# Patient Record
Sex: Female | Born: 1950
Health system: Southern US, Community
[De-identification: ages and names within clinical notes are randomized; demographics above are authoritative.]

## PROBLEM LIST (undated history)

## (undated) DIAGNOSIS — K219 Gastro-esophageal reflux disease without esophagitis: Secondary | ICD-10-CM

## (undated) DIAGNOSIS — F329 Major depressive disorder, single episode, unspecified: Secondary | ICD-10-CM

## (undated) DIAGNOSIS — N3010 Interstitial cystitis (chronic) without hematuria: Secondary | ICD-10-CM

## (undated) DIAGNOSIS — D1803 Hemangioma of intra-abdominal structures: Secondary | ICD-10-CM

## (undated) DIAGNOSIS — F32A Depression, unspecified: Secondary | ICD-10-CM

## (undated) DIAGNOSIS — I10 Essential (primary) hypertension: Secondary | ICD-10-CM

## (undated) DIAGNOSIS — M1990 Unspecified osteoarthritis, unspecified site: Secondary | ICD-10-CM

## (undated) DIAGNOSIS — F419 Anxiety disorder, unspecified: Secondary | ICD-10-CM

## (undated) DIAGNOSIS — N200 Calculus of kidney: Secondary | ICD-10-CM

## (undated) DIAGNOSIS — K222 Esophageal obstruction: Secondary | ICD-10-CM

## (undated) DIAGNOSIS — M8580 Other specified disorders of bone density and structure, unspecified site: Secondary | ICD-10-CM

## (undated) DIAGNOSIS — E7800 Pure hypercholesterolemia, unspecified: Secondary | ICD-10-CM

## (undated) HISTORY — DX: Calculus of kidney: N20.0

## (undated) HISTORY — DX: Depression, unspecified: F32.A

## (undated) HISTORY — PX: KNEE ARTHROSCOPY: SUR90

## (undated) HISTORY — DX: Pure hypercholesterolemia, unspecified: E78.00

## (undated) HISTORY — DX: Other specified disorders of bone density and structure, unspecified site: M85.80

## (undated) HISTORY — DX: Essential (primary) hypertension: I10

## (undated) HISTORY — DX: Unspecified osteoarthritis, unspecified site: M19.90

## (undated) HISTORY — PX: COLONOSCOPY: SHX174

## (undated) HISTORY — DX: Gastro-esophageal reflux disease without esophagitis: K21.9

## (undated) HISTORY — PX: AUGMENTATION MAMMAPLASTY: SUR837

## (undated) HISTORY — PX: APPENDECTOMY: SHX54

## (undated) HISTORY — DX: Major depressive disorder, single episode, unspecified: F32.9

## (undated) HISTORY — DX: Anxiety disorder, unspecified: F41.9

## (undated) HISTORY — PX: TUBAL LIGATION: SHX77

## (undated) HISTORY — DX: Esophageal obstruction: K22.2

## (undated) HISTORY — PX: TOTAL ABDOMINAL HYSTERECTOMY: SHX209

## (undated) HISTORY — PX: TOTAL ABDOMINAL HYSTERECTOMY W/ BILATERAL SALPINGOOPHORECTOMY: SHX83

## (undated) HISTORY — PX: UPPER GASTROINTESTINAL ENDOSCOPY: SHX188

## (undated) HISTORY — PX: OTHER SURGICAL HISTORY: SHX169

## (undated) HISTORY — DX: Hemangioma of intra-abdominal structures: D18.03

## (undated) HISTORY — DX: Interstitial cystitis (chronic) without hematuria: N30.10

---

## 1972-10-29 HISTORY — PX: APPENDECTOMY: SHX54

## 1975-10-30 DIAGNOSIS — D693 Immune thrombocytopenic purpura: Secondary | ICD-10-CM

## 1975-10-30 HISTORY — DX: Immune thrombocytopenic purpura: D69.3

## 1975-10-30 HISTORY — PX: TUBAL LIGATION: SHX77

## 1976-10-29 HISTORY — PX: TOTAL ABDOMINAL HYSTERECTOMY W/ BILATERAL SALPINGOOPHORECTOMY: SHX83

## 1977-10-29 HISTORY — PX: AUGMENTATION MAMMAPLASTY: SUR837

## 1978-10-29 HISTORY — PX: KNEE ARTHROSCOPY: SUR90

## 1978-10-29 HISTORY — PX: WISDOM TOOTH EXTRACTION: SHX21

## 2004-11-08 ENCOUNTER — Ambulatory Visit (HOSPITAL_COMMUNITY): Admission: RE | Admit: 2004-11-08 | Discharge: 2004-11-08 | Payer: Self-pay | Admitting: Obstetrics and Gynecology

## 2005-03-06 ENCOUNTER — Ambulatory Visit: Payer: Self-pay | Admitting: Internal Medicine

## 2005-03-21 ENCOUNTER — Encounter (INDEPENDENT_AMBULATORY_CARE_PROVIDER_SITE_OTHER): Payer: Self-pay | Admitting: Specialist

## 2005-03-21 ENCOUNTER — Ambulatory Visit: Payer: Self-pay | Admitting: Internal Medicine

## 2005-03-21 DIAGNOSIS — K222 Esophageal obstruction: Secondary | ICD-10-CM

## 2005-03-21 DIAGNOSIS — K5730 Diverticulosis of large intestine without perforation or abscess without bleeding: Secondary | ICD-10-CM | POA: Insufficient documentation

## 2005-03-21 DIAGNOSIS — D126 Benign neoplasm of colon, unspecified: Secondary | ICD-10-CM | POA: Insufficient documentation

## 2005-11-19 ENCOUNTER — Ambulatory Visit (HOSPITAL_COMMUNITY): Admission: RE | Admit: 2005-11-19 | Discharge: 2005-11-19 | Payer: Self-pay | Admitting: Obstetrics and Gynecology

## 2005-12-05 ENCOUNTER — Encounter: Admission: RE | Admit: 2005-12-05 | Discharge: 2005-12-05 | Payer: Self-pay | Admitting: Obstetrics and Gynecology

## 2006-12-09 ENCOUNTER — Ambulatory Visit (HOSPITAL_COMMUNITY): Admission: RE | Admit: 2006-12-09 | Discharge: 2006-12-09 | Payer: Self-pay | Admitting: Obstetrics and Gynecology

## 2007-12-11 ENCOUNTER — Ambulatory Visit (HOSPITAL_COMMUNITY): Admission: RE | Admit: 2007-12-11 | Discharge: 2007-12-11 | Payer: Self-pay | Admitting: Obstetrics and Gynecology

## 2008-02-18 ENCOUNTER — Ambulatory Visit (HOSPITAL_COMMUNITY): Admission: RE | Admit: 2008-02-18 | Discharge: 2008-02-18 | Payer: Self-pay | Admitting: Internal Medicine

## 2008-02-18 ENCOUNTER — Ambulatory Visit (HOSPITAL_COMMUNITY): Admission: RE | Admit: 2008-02-18 | Discharge: 2008-02-18 | Payer: Self-pay | Admitting: Pulmonary Disease

## 2008-10-29 HISTORY — PX: LIVER RESECTION: SHX1977

## 2008-10-29 HISTORY — PX: CHOLECYSTECTOMY: SHX55

## 2008-11-06 ENCOUNTER — Encounter (INDEPENDENT_AMBULATORY_CARE_PROVIDER_SITE_OTHER): Payer: Self-pay | Admitting: *Deleted

## 2008-11-06 ENCOUNTER — Inpatient Hospital Stay (HOSPITAL_COMMUNITY): Admission: EM | Admit: 2008-11-06 | Discharge: 2008-11-08 | Payer: Self-pay | Admitting: Emergency Medicine

## 2008-11-08 ENCOUNTER — Encounter (INDEPENDENT_AMBULATORY_CARE_PROVIDER_SITE_OTHER): Payer: Self-pay | Admitting: *Deleted

## 2008-11-09 ENCOUNTER — Telehealth: Payer: Self-pay | Admitting: Internal Medicine

## 2008-11-09 DIAGNOSIS — R1310 Dysphagia, unspecified: Secondary | ICD-10-CM | POA: Insufficient documentation

## 2008-11-09 DIAGNOSIS — E785 Hyperlipidemia, unspecified: Secondary | ICD-10-CM

## 2008-11-09 DIAGNOSIS — N200 Calculus of kidney: Secondary | ICD-10-CM | POA: Insufficient documentation

## 2008-11-09 DIAGNOSIS — J309 Allergic rhinitis, unspecified: Secondary | ICD-10-CM | POA: Insufficient documentation

## 2008-11-10 ENCOUNTER — Encounter: Payer: Self-pay | Admitting: Internal Medicine

## 2008-11-10 ENCOUNTER — Ambulatory Visit: Payer: Self-pay | Admitting: Gastroenterology

## 2008-11-10 DIAGNOSIS — D1803 Hemangioma of intra-abdominal structures: Secondary | ICD-10-CM | POA: Insufficient documentation

## 2008-11-10 DIAGNOSIS — R112 Nausea with vomiting, unspecified: Secondary | ICD-10-CM | POA: Insufficient documentation

## 2008-11-10 DIAGNOSIS — R933 Abnormal findings on diagnostic imaging of other parts of digestive tract: Secondary | ICD-10-CM | POA: Insufficient documentation

## 2008-11-10 DIAGNOSIS — R1011 Right upper quadrant pain: Secondary | ICD-10-CM | POA: Insufficient documentation

## 2008-11-11 ENCOUNTER — Telehealth: Payer: Self-pay | Admitting: Physician Assistant

## 2008-11-11 LAB — CONVERTED CEMR LAB
ALT: 17 units/L (ref 0–35)
Eosinophils Absolute: 0.1 10*3/uL (ref 0.0–0.7)
Eosinophils Relative: 1.6 % (ref 0.0–5.0)
HCT: 40.3 % (ref 36.0–46.0)
Hemoglobin: 14.1 g/dL (ref 12.0–15.0)
MCV: 94.7 fL (ref 78.0–100.0)
Monocytes Absolute: 0.4 10*3/uL (ref 0.1–1.0)
Monocytes Relative: 6.3 % (ref 3.0–12.0)
Neutro Abs: 3.8 10*3/uL (ref 1.4–7.7)
Platelets: 218 10*3/uL (ref 150–400)
RDW: 12 % (ref 11.5–14.6)
Total Protein: 7.6 g/dL (ref 6.0–8.3)
WBC: 5.8 10*3/uL (ref 4.5–10.5)

## 2008-11-16 ENCOUNTER — Encounter: Payer: Self-pay | Admitting: Gastroenterology

## 2008-11-28 ENCOUNTER — Encounter: Payer: Self-pay | Admitting: Gastroenterology

## 2008-12-14 ENCOUNTER — Encounter: Payer: Self-pay | Admitting: Gastroenterology

## 2009-03-07 ENCOUNTER — Ambulatory Visit (HOSPITAL_COMMUNITY): Admission: RE | Admit: 2009-03-07 | Discharge: 2009-03-07 | Payer: Self-pay | Admitting: Obstetrics and Gynecology

## 2010-03-09 ENCOUNTER — Ambulatory Visit (HOSPITAL_COMMUNITY): Admission: RE | Admit: 2010-03-09 | Discharge: 2010-03-09 | Payer: Self-pay | Admitting: Obstetrics and Gynecology

## 2010-05-02 ENCOUNTER — Encounter (INDEPENDENT_AMBULATORY_CARE_PROVIDER_SITE_OTHER): Payer: Self-pay

## 2010-05-02 ENCOUNTER — Ambulatory Visit: Payer: Self-pay | Admitting: Internal Medicine

## 2010-05-02 ENCOUNTER — Telehealth: Payer: Self-pay | Admitting: Internal Medicine

## 2010-05-03 ENCOUNTER — Ambulatory Visit: Payer: Self-pay | Admitting: Internal Medicine

## 2010-10-29 HISTORY — PX: BASAL CELL CARCINOMA EXCISION: SHX1214

## 2010-11-28 NOTE — Miscellaneous (Signed)
Summary: Lec previsit  Clinical Lists Changes  Observations: Added new observation of NKA: T (05/02/2010 14:06)

## 2010-11-28 NOTE — Miscellaneous (Signed)
Summary: rx prilosec  Clinical Lists Changes  Medications: Added new medication of PRILOSEC 20 MG  CPDR (OMEPRAZOLE) 1 po each day 30 minutes before meal - Signed Rx of PRILOSEC 20 MG  CPDR (OMEPRAZOLE) 1 po each day 30 minutes before meal;  #30 x 3;  Signed;  Entered by: Sherren Kerns RN;  Authorized by: Hart Carwin MD;  Method used: Electronically to [facility] [address], [place], [place], [place]  [address], Ph: [phone], Fax: [phone] Observations: Added new observation of ALLERGY REV: Done (05/03/2010 16:08)    Prescriptions: PRILOSEC 20 MG  CPDR (OMEPRAZOLE) 1 po each day 30 minutes before meal  #30 x 3   Entered by:   Sherren Kerns RN   Authorized by:   Hart Carwin MD   Signed by:   Sherren Kerns RN on 05/03/2010   Method used:   Electronically to        [facility] (retail)       [address]       [place], [place]  [address]       Ph: [phone]       Fax: [phone]   RxID:   [id number]

## 2010-11-28 NOTE — Procedures (Signed)
Summary: Upper Endoscopy  Patient: [name] Note: All result statuses are Final unless otherwise noted.  Tests: (1) Upper Endoscopy (EGD)   EGD Upper Endoscopy       DONE     [facility]     [address]     [place], [place]  [address]           ENDOSCOPY PROCEDURE REPORT           PATIENT:  Kelly Lozano, Kelly Lozano  MR#:  [id number]     BIRTHDATE:  Nov 01, 1950, 59 yrs. old  GENDER:  female           ENDOSCOPIST:  Hedwig Morton. Juanda Chance, MD     Referred by:  Carylon Perches, M.D.           PROCEDURE DATE:  05/03/2010     PROCEDURE:  EGD with dilatation over guidewire     ASA CLASS:  Class I     INDICATIONS:  dysphagia hx es. stricture 1998 and 2006,     2 episodes of food impaction in last week           MEDICATIONS:   Versed 8 mg, Fentanyl 75 mcg     TOPICAL ANESTHETIC:  Exactacain Spray           DESCRIPTION OF PROCEDURE:   After the risks benefits and     alternatives of the procedure were thoroughly explained, informed     consent was obtained.  The [facility] GIF-H180 [id number] endoscope was     introduced through the mouth and advanced to the second portion of     the duodenum, without limitations.  The instrument was slowly     withdrawn as the mucosa was fully examined.     <<PROCEDUREIMAGES>>           A stricture was found in the distal esophagus (see image1). mild     stricture with esophagitis Savary dilation over a guidewire     14,,15,16 mm dilators, small amount of blood on the last dilator     Otherwise the examination was normal (see image6, image5, image4,     and image3).    Retroflexed views revealed no abnormalities.     The scope was then withdrawn from the patient and the procedure     completed.           COMPLICATIONS:  None           ENDOSCOPIC IMPRESSION:     1) Stricture in the distal esophagus     2) Otherwise normal examination     s/p Savary dilation to 10F     RECOMMENDATIONS:     1) Anti-reflux regimen to be follow     Prelosec 20 mg, # 30, 1 po aqd, 3  refills           REPEAT EXAM:  In 0 year(s) for.  redilate prn           ______________________________     Hedwig Morton. Juanda Chance, MD           CC:           n.     eSIGNED:   Hedwig Morton. Crystale Giannattasio at 05/03/2010 03:47 PM           Quay Burow, [id number]  Note: An exclamation mark (!) indicates a result that was not dispersed into the flowsheet. Document Creation Date: 05/03/2010 3:47 PM _______________________________________________________________________  (1) Order result status: Final Collection  or observation date-time: 05/03/2010 15:38 Requested date-time:  Receipt date-time:  Reported date-time:  Referring Physician:   Ordering Physician: Lina Sar [phone]) Specimen Source:  Source: Launa Grill Order Number: [id number] Lab site:

## 2010-11-28 NOTE — Progress Notes (Signed)
Summary: triage  Phone Note Call from Patient Call back at [phone]   Caller: Patient Call For: Juanda Chance Reason for Call: Talk to Nurse Summary of Call: Patient wants t be seen today by anyone states that she choked twice this weekend. Initial call taken by: Tawni Levy,  May 02, 2010 8:25 AM  Follow-up for Phone Call        Patient  with a hx of esophageal stricture in 06 with dil.  Patient  c/o 2 episodes over the weekend of choking and had to have the heimlich performed on her twice.  Discussed with Dr Juanda Chance patient is scheduled for ESA for 05/03/10 3:00, she will come in for pre-visit today at 2:30 Follow-up by: Darcey Nora RN, CGRN,  May 02, 2010 9:07 AM

## 2010-11-28 NOTE — Letter (Signed)
Summary: EGD Instructions  [facility]  [address] [place], [place] [address]   Phone: [phone]  Fax: [phone]       Kelly Lozano    20-Feb-1951    MRN: [id number]       Procedure Day Dorna Bloom:  [facility]  05/03/10     Arrival Time:  2:00pm     Procedure Time: 3:00pm     Location of Procedure:                    [name]   PREPARATION FOR ENDOSCOPY   On [facility], 07/06  THE DAY OF THE PROCEDURE:  1.   No solid foods, milk or milk products are allowed after midnight the night before your procedure.  2.   Do not drink anything colored red or purple.  Avoid juices with pulp.  No orange juice.  3.  You may drink clear liquids until 1:00pm , which is 2 hours before your procedure.                                                                                                CLEAR LIQUIDS INCLUDE: Water Jello Ice Popsicles Tea (sugar ok, no milk/cream) Powdered fruit flavored drinks Coffee (sugar ok, no milk/cream) Gatorade Juice: apple, white grape, white cranberry  Lemonade Clear bullion, consomm, broth Carbonated beverages (any kind) Strained chicken noodle soup Hard Candy   MEDICATION INSTRUCTIONS  Unless otherwise instructed, you should take regular prescription medications with a small sip of water as early as possible the morning of your procedure.          OTHER INSTRUCTIONS  You will need a responsible adult at least 59 years of age to accompany you and drive you home.   This person must remain in the waiting room during your procedure.  Wear loose fitting clothing that is easily removed.  Leave jewelry and other valuables at home.  However, you may wish to bring a book to read or an iPod/MP3 player to listen to music as you wait for your procedure to start.  Remove all body piercing jewelry and leave at home.  Total time from sign-in until discharge is approximately 2-3 hours.  You should go  home directly after your procedure and rest.  You can resume normal activities the day after your procedure.  The day of your procedure you should not:   Drive   Make legal decisions   Operate machinery   Drink alcohol   Return to work  You will receive specific instructions about eating, activities and medications before you leave.    The above instructions have been reviewed and explained to me by   Ulis Rias RN  May 02, 2010 3:04 PM     I fully understand and can verbalize these instructions _____________________________ Date _________

## 2011-01-18 ENCOUNTER — Other Ambulatory Visit: Payer: Self-pay | Admitting: *Deleted

## 2011-01-18 MED ORDER — OMEPRAZOLE 20 MG PO CPDR: 20.0000 mg | DELAYED_RELEASE_CAPSULE | Freq: Every day | ORAL | Status: DC

## 2011-02-12 LAB — URINE MICROSCOPIC-ADD ON

## 2011-02-12 LAB — DIFFERENTIAL
Basophils Absolute: 0 10*3/uL (ref 0.0–0.1)
Eosinophils Relative: 0 % (ref 0–5)
Lymphocytes Relative: 17 % (ref 12–46)
Lymphocytes Relative: 24 % (ref 12–46)
Lymphs Abs: 1.3 10*3/uL (ref 0.7–4.0)
Lymphs Abs: 1.8 10*3/uL (ref 0.7–4.0)
Monocytes Absolute: 0.4 10*3/uL (ref 0.1–1.0)
Monocytes Absolute: 0.8 10*3/uL (ref 0.1–1.0)
Monocytes Relative: 7 % (ref 3–12)
Monocytes Relative: 7 % (ref 3–12)
Neutro Abs: 7.8 10*3/uL — ABNORMAL HIGH (ref 1.7–7.7)

## 2011-02-12 LAB — CBC
HCT: 33.6 % — ABNORMAL LOW (ref 36.0–46.0)
HCT: 34.6 % — ABNORMAL LOW (ref 36.0–46.0)
HCT: 38.2 % (ref 36.0–46.0)
Hemoglobin: 11.3 g/dL — ABNORMAL LOW (ref 12.0–15.0)
Hemoglobin: 13 g/dL (ref 12.0–15.0)
MCV: 95.4 fL (ref 78.0–100.0)
RBC: 3.63 MIL/uL — ABNORMAL LOW (ref 3.87–5.11)
RBC: 4.02 MIL/uL (ref 3.87–5.11)
RDW: 12.9 % (ref 11.5–15.5)
WBC: 10.5 10*3/uL (ref 4.0–10.5)
WBC: 5.5 10*3/uL (ref 4.0–10.5)
WBC: 5.7 10*3/uL (ref 4.0–10.5)

## 2011-02-12 LAB — COMPREHENSIVE METABOLIC PANEL
AST: 17 U/L (ref 0–37)
Alkaline Phosphatase: 62 U/L (ref 39–117)
BUN: 3 mg/dL — ABNORMAL LOW (ref 6–23)
CO2: 27 mEq/L (ref 19–32)
Chloride: 108 mEq/L (ref 96–112)
Creatinine, Ser: 0.84 mg/dL (ref 0.4–1.2)
GFR calc Af Amer: >60 mL/min (ref >60)
GFR calc non Af Amer: >60 mL/min (ref >60)
Total Bilirubin: 0.4 mg/dL (ref 0.3–1.2)

## 2011-02-12 LAB — BASIC METABOLIC PANEL
Calcium: 9.1 mg/dL (ref 8.4–10.5)
GFR calc Af Amer: >60 mL/min (ref >60)
GFR calc non Af Amer: >60 mL/min (ref >60)
GFR calc non Af Amer: >60 mL/min (ref >60)
Glucose, Bld: 105 mg/dL — ABNORMAL HIGH (ref 70–99)
Potassium: 4.2 mEq/L (ref 3.5–5.1)
Potassium: 4.4 mEq/L (ref 3.5–5.1)
Sodium: 139 mEq/L (ref 135–145)
Sodium: 141 mEq/L (ref 135–145)

## 2011-02-12 LAB — URINALYSIS, ROUTINE W REFLEX MICROSCOPIC
Bilirubin Urine: NEGATIVE
Glucose, UA: NEGATIVE mg/dL
Nitrite: NEGATIVE
Specific Gravity, Urine: 1.029 (ref 1.005–1.030)
pH: 5.5 (ref 5.0–8.0)

## 2011-02-12 LAB — SEDIMENTATION RATE: Sed Rate: 19 mm/hr (ref 0–22)

## 2011-02-12 LAB — LIPID PANEL
HDL: 86 mg/dL (ref >39)
Total CHOL/HDL Ratio: 2.7 RATIO

## 2011-02-12 LAB — PROTIME-INR
INR: 1 (ref 0.00–1.49)
Prothrombin Time: 13.7 seconds (ref 11.6–15.2)

## 2011-02-12 LAB — HIGH SENSITIVITY CRP: CRP, High Sensitivity: 46.9 mg/L — ABNORMAL HIGH

## 2011-02-12 LAB — HEPATIC FUNCTION PANEL
Albumin: 3.3 g/dL — ABNORMAL LOW (ref 3.5–5.2)
Alkaline Phosphatase: 66 U/L (ref 39–117)
Total Protein: 6.6 g/dL (ref 6.0–8.3)

## 2011-02-12 LAB — URINE CULTURE: Colony Count: NO GROWTH

## 2011-03-02 ENCOUNTER — Other Ambulatory Visit: Payer: Self-pay | Admitting: Obstetrics and Gynecology

## 2011-03-02 DIAGNOSIS — Z1231 Encounter for screening mammogram for malignant neoplasm of breast: Secondary | ICD-10-CM

## 2011-03-13 NOTE — H&P (Signed)
Kelly Lozano, Kelly Lozano                ACCOUNT NO.:  [id number]   MEDICAL RECORD NO.:  [id number]          PATIENT TYPE:  EMS   LOCATION:  ED                           FACILITY[facility]  [facility]   PHYSICIAN:  Marcellus Scott, MD     DATE OF BIRTH:  1950-12-01   DATE OF ADMISSION:  11/06/2008  DATE OF DISCHARGE:                              HISTORY & PHYSICAL   PRIMARY CARE PHYSICIAN:  Dr. Ouida Sills in [place], [place].   CHIEF COMPLAINT:  Right flank pain, nausea and vomiting.   HISTORY OF PRESENT ILLNESS:  Kelly Lozano is a pleasant 60 year old  Caucasian female patient with history of dyslipidemia, hemangioma of the  liver diagnosed approximately a year ago, remote history of kidney  stones.  Most of the history is obtained from the patient's spouse who  is at the bedside.  The patient currently is drowsy secondary to  medications and not able to provide significant history.   She was in her usual state of health until some time last afternoon when  she started complaining of right-sided sharp flank pain 9/10 in severity  with no radiation.  This was not associated with fever, chills, rigors,  dysuria or frequency.  There is some question of hematuria.  The patient  sought attention with her primary medical doctor and was advised  admission to the [facility].  The patient, however, declined  that and indicated that she would go home and if she got worse she would  come to the [facility] for admission.  Some time this morning  the patient started having nausea and vomiting, multiple episodes  consisting of bilious colored liquid with no coffee grounds or blood and  persisting right flank pain.  She was seen in the emergency room by the  ED physician.  Currently after pain medications and antiemetics the  patient indicates that her pain is about 3/10.  She is drowsy but easily  arousable and has slight confusion probably secondary to medications.   PAST MEDICAL HISTORY:  1. Dyslipidemia.  2. Hemangioma of the liver.  3. Kidney stones.   PAST SURGICAL HISTORY:  1. Appendectomy.  2. Hysterectomy.  3. Esophageal dilatation for strictures.   ALLERGIES:  No known drug allergies.   MEDICATIONS:  Not fully clear.  I have requested the spouse to bring her  medications for review.  1. Vitamin D one tablet daily.  2. Estrace.  3. Zetia 10 mg daily.   FAMILY HISTORY:  Noncontributory.   SOCIAL HISTORY:  The patient is married for a year.  She is a Engineer, civil (consulting) at  [employer].  She is a nonsmoker.  She occasionally drinks wine.  There is no history of drug abuse.   REVIEW OF SYSTEMS:  A complete 14 system review done and apart from  history of presenting illness is noncontributory.  Of note, the patient  has not seen any consultants for her hemangioma.  She is unable to  recollect the name of the GI physician she sees for esophageal  dilatation.   PHYSICAL EXAMINATION:  GENERAL:  Kelly Lozano is  a moderately built and  nourished female patient who is in no obvious distress at this time.  VITAL SIGNS:  Temperature 97.7 degrees Fahrenheit, blood pressure  135/80, pulse is 65 per minute and regular, respirations 16 per minute,  saturating at 97% on room air.  HEENT:  Atraumatic, normocephalic.  Pupils equally reacting to light and  accommodation.  NECK:  Supple.  No JVD or carotid bruit.  LYMPHATICS:  No lymphadenopathy.  RESPIRATORY:  Clear to auscultation.  CARDIOVASCULAR:  First and second heart sounds heard.  No murmurs.  ABDOMEN:  Nondistended.  Minimal epigastric tenderness.  No rigidity,  guarding or rebound.  No organomegaly or mass appreciated.  Bowel sounds  are normally heard.  Right renal angle tenderness.  CENTRAL NERVOUS SYSTEM:  The patient is drowsy but easily arousable and  oriented in person, place and time.  No focal neurological deficits.  EXTREMITIES:  No clubbing, cyanosis or edema.  Peripheral pulses are  symmetrically felt.  SKIN:   Without any rashes.  MUSCULOSKELETAL:  Unremarkable.   LAB DATA:  Urinalysis with 21-50 white blood cells per high-powered  field and many bacteria and also has calcium oxalate crystals.  CBC with  hemoglobin 13, hematocrit 38.2, white blood cell 10.5, platelets 196.  Basic metabolic panel unremarkable.  BUN 10, creatinine 0.78.  CT of the  abdomen without contrast, impression - redemonstration of the  heterogenous lesion in the inferior right hepatic lobe.  This lesion was  described as a giant hemangioma on the previous MR study.  The size and  configuration of this lesion has not significantly changed since February 18, 2008.  However, there is new stranding and edema adjacent to this  lesion.  No significant intra-abdominal fluid or pneumoperitoneum.  No  evidence for kidney stone or hydronephrosis.  A CT of the pelvis,  impression - no acute pelvic findings.  Status post hysterectomy.   ASSESSMENT AND PLAN:  1. Acute right pyelonephritis.  Will send urine for culture.  Will      hydrate with IV fluids and continue IV ciprofloxacin pending      culture.  The patient has received a dose in the ED.  2. Right flank pain likely secondary to acute pyelonephritis.  Other      differential diagnosis is nephrolithiasis although the CT of the      abdomen was negative for same.  Please note that although the      patient has bacteria and white blood cells in the urine she has no      fever or leukocytosis again raising suspicion for some other cause      of her right flank pain.  The other differential diagnosis is      bleeding from the hemangioma.  Will provide pain medications and      monitor.  3. Nausea and vomiting secondary to problems #1 and #2.  Clear liquids      as tolerated and advance diet as tolerated.  Will provide      antiemetic medications.  4. Hemangioma of the liver.  Rule out bleeding from same.  Will check      hemoglobin and hematocrit q.6 hourly for 24 hours.  [facility] has      been consulted.  5. Altered mental status.  Likely secondary to pain medications and      antiemetics.  Monitor.  6. Dyslipidemia.   Time spent on coordinating this admission is 45 minutes.  Marcellus Scott, MD  Electronically Signed     AH/MEDQ  D:  11/06/2008  T:  11/06/2008  Job:  [id number]   cc:   Kingsley Callander. Ouida Sills, MD  Fax: [phone]   Rachael Fee, MD  [address]  [place], [place] [address]

## 2011-03-13 NOTE — Discharge Summary (Signed)
NAMESHADAY, RAYBORN                ACCOUNT NO.:  [id number]   MEDICAL RECORD NO.:  [id number]          PATIENT TYPE:  INP   LOCATION:  1306                         FACILITY:  [facility]   PHYSICIAN:  Eduard Clos, MDDATE OF BIRTH:  Mar 17, 1951   DATE OF ADMISSION:  11/06/2008  DATE OF DISCHARGE:  11/08/2008                               DISCHARGE SUMMARY   A 60 year old female with chronic history of  extremity __________  dyslipidemia __________ patient had a UA __________ UTI, was started on  empiric antibiotics.  CT abdomen and pelvis was done without contrast  which showed __________of the liver with some edema and stranding  __________.  Patient was placed on __________ q.6.  Hemoglobin was  checked and initially GI consult called __________ discussed with the  surgeon who felt that possibly __________ at this time and that  __________ Dr. Rocky Link advised that repeated CT abdomen and pelvis done with  contrast __________ there is no evidence of bleed and patient's  hemoglobin had become stable.  Patient is tolerating diet.  __________  urine culture negative.  Patient is discharged home and advised that she  will need further followup closely __________ with primary care and  __________ to which she has agreed, which she had done today.  CT  abdomen and pelvis November 07, 2008 shows redemonstration of __________  UTI __________   MEDICATION ON DISCHARGE:  Vicodin DS __________   PLAN:  Patient __________ with her primary care physician __________  advised further workup and __________ physician __________      Eduard Clos, MD     ANK/MEDQ  D:  11/08/2008  T:  11/08/2008  Job:  [id number]

## 2011-03-15 ENCOUNTER — Ambulatory Visit (HOSPITAL_COMMUNITY)
Admission: RE | Admit: 2011-03-15 | Discharge: 2011-03-15 | Disposition: A | Discharge status: Home / Self Care | Payer: BC Managed Care – PPO | Source: Ambulatory Visit | Attending: Obstetrics and Gynecology | Admitting: Obstetrics and Gynecology

## 2011-03-15 DIAGNOSIS — Z1231 Encounter for screening mammogram for malignant neoplasm of breast: Secondary | ICD-10-CM | POA: Insufficient documentation

## 2011-03-16 NOTE — Discharge Summary (Signed)
NAMEGREGORY, DOWE                ACCOUNT NO.:  [id number]   MEDICAL RECORD NO.:  [id number]          PATIENT TYPE:  INP   LOCATION:  1306                         FACILITY:  [facility]   PHYSICIAN:  Eduard Clos, MDDATE OF BIRTH:  [birth year]   DATE OF ADMISSION:  11/06/2008  DATE OF DISCHARGE:  11/08/2008                               DISCHARGE SUMMARY   HOSPITAL COURSE:  This 60 year old female with a history of dyslipidemia  and hemangioma of the liver, possible kidney stone on the right, with  pain.  On admission the patient had a urinalysis which was compatible  with a urinary tract infection.  The patient had a CAT scan of the  abdomen and pelvis which showed a hemangioma of the liver which was  previously known, as was stated earlier in the MRI scan.  At this time  the lesion showed some stranding and edema.  Initially a GI consultation  was called, but the gastroenterologist failed to call surgery.  I did  discuss with the on-call surgeon and asked for surgery to observe the  patient and also with an opinion from the interventional radiologist.  I  discussed with the interventional radiologist who advised that we repeat  a CAT scan.  If there are no significant changes, will need to follow as  an outpatient at the GI Center.  A repeat CAT scan was done with no  significant change.  The patient's pain had improved. The urine cultures  were negative.  There was no active bleeding seen in the hemangioma.   At this time the patient was able to go home.  The patient was  discharged home.  The patient's urine cultures were negative.  The pain  was resolved.  The patient had tolerated three days off of antibiotics.  At the time of discharge the patient was hemodynamically stable.   PROCEDURE DURING STAY:  1. CT of the abdomen and pelvis on 11/06/2008, showing demonstration      of a hemangioma lesion in the inferior right hepatic lobe.  This      lesion was described as a giant  hemangioma on the previous MR      study.  The size and configuration of this lesion showed no      significant change since 02/18/2008, although there is new      stranding and edema but no significant No evidence for kidney      stones or hydronephrosis.  No Acute findings.  2. A CT of the abdomen and pelvis with contrast on 11/08/2008, showing      no evidence of active bleeding from the giant right lobe of the      liver.  No acute abnormality in the abdomen.  Mild right lower lobe      , borderline hiatal hernia.  No acute or significant abnormality in      the pelvis.   FINAL DIAGNOSES:  1. Right flank pain with hemangioma of the liver.  2. Urinary tract infection.  3. Dyslipidemia.   DISCHARGE MEDICATIONS:  1. Omeprazole 40 mg one p.o. daily.  2. Vicodin 5 mg p.o. q.4-6h. p.r.n. pain.   PLAN:  The patient advised to call her primary care physician within one  week's time.  The patient also advised that she will need to follow up  with the higher Center at [facility] for her hemangioma, for which  she has agreed.      Eduard Clos, MD  Electronically Signed     ANK/MEDQ  D:  11/25/2008  T:  11/25/2008  Job:  [id number]

## 2011-05-15 ENCOUNTER — Other Ambulatory Visit: Payer: Self-pay | Admitting: *Deleted

## 2011-05-15 MED ORDER — OMEPRAZOLE 20 MG PO CPDR: 20.0000 mg | DELAYED_RELEASE_CAPSULE | Freq: Every day | ORAL | Status: DC

## 2011-08-17 ENCOUNTER — Other Ambulatory Visit: Payer: Self-pay | Admitting: Internal Medicine

## 2011-11-23 ENCOUNTER — Other Ambulatory Visit: Payer: Self-pay | Admitting: Internal Medicine

## 2011-12-07 ENCOUNTER — Other Ambulatory Visit: Payer: Self-pay | Admitting: Dermatology

## 2012-02-26 ENCOUNTER — Telehealth: Payer: Self-pay | Admitting: Internal Medicine

## 2012-02-27 NOTE — Telephone Encounter (Signed)
Left message for patient to call back  

## 2012-02-27 NOTE — Telephone Encounter (Signed)
I have advised patient that she is indeed due for colonoscopy. Patient has scheduled a time and date for previsit and colonoscopy procedure.

## 2012-02-27 NOTE — Telephone Encounter (Signed)
Dr Juanda Chance- Patient's last colonoscopy completed 03/21/05 indicates that she needs recall colon in 5 years; however, her recall is in our system for 2016. She had only polypoid mucosa on her colonoscopy in 2006, but she has a family history of colon cancer in a parent. Should she be due for colon now or 2016? I will get chart if you need me to.

## 2012-02-27 NOTE — Telephone Encounter (Signed)
She is right, she does need a recall in 5 years. Her family hx escaped me.

## 2012-03-07 ENCOUNTER — Encounter: Payer: Self-pay | Admitting: Internal Medicine

## 2012-03-07 ENCOUNTER — Ambulatory Visit (AMBULATORY_SURGERY_CENTER): Payer: BC Managed Care – PPO | Admitting: *Deleted

## 2012-03-07 VITALS — Ht 61.0 in | Wt 155.0 lb

## 2012-03-07 DIAGNOSIS — Z860100 Personal history of colon polyps, unspecified: Secondary | ICD-10-CM

## 2012-03-07 DIAGNOSIS — Z1211 Encounter for screening for malignant neoplasm of colon: Secondary | ICD-10-CM

## 2012-03-07 DIAGNOSIS — Z800 Family history of malignant neoplasm of digestive organs: Secondary | ICD-10-CM

## 2012-03-07 MED ORDER — PEG-KCL-NACL-NASULF-NA ASC-C 100 G PO SOLR: ORAL | Status: DC

## 2012-03-12 ENCOUNTER — Ambulatory Visit (AMBULATORY_SURGERY_CENTER): Payer: BC Managed Care – PPO | Admitting: Internal Medicine

## 2012-03-12 ENCOUNTER — Encounter: Payer: Self-pay | Admitting: Internal Medicine

## 2012-03-12 VITALS — BP 128/86 | HR 58 | Temp 95.4°F | Resp 15 | Ht 61.0 in | Wt 150.0 lb

## 2012-03-12 DIAGNOSIS — Z800 Family history of malignant neoplasm of digestive organs: Secondary | ICD-10-CM

## 2012-03-12 DIAGNOSIS — Z860100 Personal history of colon polyps, unspecified: Secondary | ICD-10-CM

## 2012-03-12 DIAGNOSIS — Z1211 Encounter for screening for malignant neoplasm of colon: Secondary | ICD-10-CM

## 2012-03-12 MED ORDER — SODIUM CHLORIDE 0.9 % IV SOLN: 500.0000 mL | INTRAVENOUS | Status: DC

## 2012-03-12 NOTE — Op Note (Addendum)
[facility] [address] [place], [place]  [address]  COLONOSCOPY PROCEDURE REPORT  PATIENT:  Kelly Lozano, Kelly Lozano  MR#:  [id number] BIRTHDATE:  09/09/1951, 61 yrs. old  GENDER:  female ENDOSCOPIST:  Hedwig Morton. Juanda Chance, MD REF. BY:  Carylon Perches, M.D..Dr Meredeth Ide PROCEDURE DATE:  03/12/2012 PROCEDURE:  Colonoscopy 19147 ASA CLASS:  Class I INDICATIONS:  family history of colon cancer parent with colon cancer prior colon in 1998 and 2006- polyp removed - polypoid mucosa MEDICATIONS:   MAC sedation, administered by CRNA, propofol (Diprivan) 220 mg  DESCRIPTION OF PROCEDURE:   After the risks and benefits and of the procedure were explained, informed consent was obtained. Digital rectal exam was performed and revealed no rectal masses. The LB [id number]-[id number] endoscope was introduced through the anus and advanced to the cecum, which was identified by both the appendix and ileocecal valve.  The quality of the prep was excellent, using MoviPrep.  The instrument was then slowly withdrawn as the colon was fully examined. <<PROCEDUREIMAGES>>  FINDINGS:  Mild diverticulosis was found in the sigmoid colon (see image1).  This was otherwise a normal examination of the colon (see image4, image3, and image2).   Retroflexed views in the rectum revealed no abnormalities.    The scope was then withdrawn from the patient and the procedure completed.  COMPLICATIONS:  None ENDOSCOPIC IMPRESSION: 1) Mild diverticulosis in the sigmoid colon 2) Otherwise normal examination RECOMMENDATIONS: 1) High fiber diet.  REPEAT EXAM:  In 5 year(s) for.  ______________________________ Hedwig Morton. Juanda Chance, MD  CC:  n. REVISED:  03/12/2012 09:14 AM eSIGNED:   Hedwig Morton. Ladeidra Borys at 03/12/2012 09:14 AM  Quay Burow, [phone]

## 2012-03-12 NOTE — Progress Notes (Signed)
Patient did not experience any of the following events: a burn prior to discharge; a fall within the facility; wrong site/side/patient/procedure/implant event; or a hospital transfer or hospital admission upon discharge from the facility. (G8907) Patient did not have preoperative order for IV antibiotic SSI prophylaxis. (G8918)  

## 2012-03-12 NOTE — Patient Instructions (Signed)
Follow discharge instructions given today. Repeat colonoscopy in 5 years. Diverticulosis seen today. Try to follow high fiber diet. See handouts.  Thank you!!   YOU HAD AN ENDOSCOPIC PROCEDURE TODAY AT THE [facility]: Refer to the procedure report that was given to you for any specific questions about what was found during the examination.  If the procedure report does not answer your questions, please call your gastroenterologist to clarify.  If you requested that your care partner not be given the details of your procedure findings, then the procedure report has been included in a sealed envelope for you to review at your convenience later.  YOU SHOULD EXPECT: Some feelings of bloating in the abdomen. Passage of more gas than usual.  Walking can help get rid of the air that was put into your GI tract during the procedure and reduce the bloating. If you had a lower endoscopy (such as a colonoscopy or flexible sigmoidoscopy) you may notice spotting of blood in your stool or on the toilet paper. If you underwent a bowel prep for your procedure, then you may not have a normal bowel movement for a few days.  DIET: Your first meal following the procedure should be a light meal and then it is ok to progress to your normal diet.  A half-sandwich or bowl of soup is an example of a good first meal.  Heavy or fried foods are harder to digest and may make you feel nauseous or bloated.  Likewise meals heavy in dairy and vegetables can cause extra gas to form and this can also increase the bloating.  Drink plenty of fluids but you should avoid alcoholic beverages for 24 hours.  ACTIVITY: Your care partner should take you home directly after the procedure.  You should plan to take it easy, moving slowly for the rest of the day.  You can resume normal activity the day after the procedure however you should NOT DRIVE or use heavy machinery for 24 hours (because of the sedation medicines used during the  test).    SYMPTOMS TO REPORT IMMEDIATELY: A gastroenterologist can be reached at any hour.  During normal business hours, 8:30 AM to 5:00 PM Monday through Friday, call [phone].  After hours and on weekends, please call the GI answering service at [phone] who will take a message and have the physician on call contact you.   Following lower endoscopy (colonoscopy or flexible sigmoidoscopy):  Excessive amounts of blood in the stool  Significant tenderness or worsening of abdominal pains  Swelling of the abdomen that is new, acute  Fever of 100F or higher  FOLLOW UP: If any biopsies were taken you will be contacted by phone or by letter within the next 1-3 weeks.  Call your gastroenterologist if you have not heard about the biopsies in 3 weeks.  Our staff will call the home number listed on your records the next business day following your procedure to check on you and address any questions or concerns that you may have at that time regarding the information given to you following your procedure. This is a courtesy call and so if there is no answer at the home number and we have not heard from you through the emergency physician on call, we will assume that you have returned to your regular daily activities without incident.  SIGNATURES/CONFIDENTIALITY: You and/or your care partner have signed paperwork which will be entered into your electronic medical record.  These signatures attest to  the fact that that the information above on your After Visit Summary has been reviewed and is understood.  Full responsibility of the confidentiality of this discharge information lies with you and/or your care-partner.

## 2012-03-13 ENCOUNTER — Telehealth: Payer: Self-pay | Admitting: *Deleted

## 2012-03-13 NOTE — Telephone Encounter (Signed)
  Follow up Call-  Call back number 03/12/2012  Post procedure Call Back phone  # [phone] or [phone]  Permission to leave phone message Yes     Patient questions:  Do you have a fever, pain , or abdominal swelling? no Pain Score  0 *  Have you tolerated food without any problems? yes  Have you been able to return to your normal activities? yes  Do you have any questions about your discharge instructions: Diet   no Medications  no Follow up visit  no  Do you have questions or concerns about your Care? no  Actions: * If pain score is 4 or above: No action needed, pain <4.

## 2012-03-19 ENCOUNTER — Other Ambulatory Visit: Payer: Self-pay | Admitting: Obstetrics and Gynecology

## 2012-03-19 DIAGNOSIS — Z1231 Encounter for screening mammogram for malignant neoplasm of breast: Secondary | ICD-10-CM

## 2012-03-25 ENCOUNTER — Other Ambulatory Visit: Payer: Self-pay | Admitting: *Deleted

## 2012-03-25 MED ORDER — OMEPRAZOLE 20 MG PO CPDR: 20.0000 mg | DELAYED_RELEASE_CAPSULE | Freq: Every day | ORAL | Status: DC

## 2012-04-14 ENCOUNTER — Ambulatory Visit (HOSPITAL_COMMUNITY)
Admission: RE | Admit: 2012-04-14 | Discharge: 2012-04-14 | Disposition: A | Discharge status: Home / Self Care | Payer: BC Managed Care – PPO | Source: Ambulatory Visit | Attending: Obstetrics and Gynecology | Admitting: Obstetrics and Gynecology

## 2012-04-14 DIAGNOSIS — Z1231 Encounter for screening mammogram for malignant neoplasm of breast: Secondary | ICD-10-CM | POA: Insufficient documentation

## 2012-06-23 ENCOUNTER — Other Ambulatory Visit: Payer: Self-pay | Admitting: *Deleted

## 2012-06-23 MED ORDER — OMEPRAZOLE 20 MG PO CPDR: 20.0000 mg | DELAYED_RELEASE_CAPSULE | Freq: Every day | ORAL | Status: DC

## 2012-09-22 ENCOUNTER — Other Ambulatory Visit: Payer: Self-pay | Admitting: *Deleted

## 2012-09-22 MED ORDER — OMEPRAZOLE 20 MG PO CPDR: 20.0000 mg | DELAYED_RELEASE_CAPSULE | Freq: Every day | ORAL | Status: DC

## 2012-10-29 HISTORY — PX: EYE SURGERY: SHX253

## 2012-11-25 ENCOUNTER — Other Ambulatory Visit: Payer: Self-pay

## 2012-12-10 ENCOUNTER — Other Ambulatory Visit: Payer: Self-pay | Admitting: Dermatology

## 2013-01-24 ENCOUNTER — Other Ambulatory Visit: Payer: Self-pay | Admitting: Internal Medicine

## 2013-02-14 ENCOUNTER — Other Ambulatory Visit (HOSPITAL_COMMUNITY): Payer: Self-pay | Admitting: Obstetrics and Gynecology

## 2013-02-16 ENCOUNTER — Other Ambulatory Visit: Payer: Self-pay | Admitting: *Deleted

## 2013-02-16 ENCOUNTER — Encounter: Payer: Self-pay | Admitting: *Deleted

## 2013-02-16 MED ORDER — ERGOCALCIFEROL 1.25 MG (50000 UT) PO CAPS: 50000.0000 [IU] | ORAL_CAPSULE | ORAL | Status: DC

## 2013-02-16 NOTE — Telephone Encounter (Signed)
AEX scheduled for 03/25/13  #4/0rf's sent through to [facility] pharmacy to last pt until aex//js

## 2013-02-17 ENCOUNTER — Other Ambulatory Visit: Payer: Self-pay | Admitting: *Deleted

## 2013-02-17 MED ORDER — ERGOCALCIFEROL 1.25 MG (50000 UT) PO CAPS: 50000.0000 [IU] | ORAL_CAPSULE | ORAL | Status: DC

## 2013-02-28 ENCOUNTER — Other Ambulatory Visit (HOSPITAL_COMMUNITY): Payer: Self-pay | Admitting: Obstetrics and Gynecology

## 2013-03-02 NOTE — Telephone Encounter (Signed)
eScribe request for refill on MINIVELLE Last filled - 02/22/12, 3months x 3 refills Last AEX - 02/22/12 Next AEX - 03/25/13 One month supply sent to pharmacy.

## 2013-03-24 ENCOUNTER — Encounter: Payer: Self-pay | Admitting: *Deleted

## 2013-03-25 ENCOUNTER — Encounter: Payer: Self-pay | Admitting: Obstetrics and Gynecology

## 2013-03-25 ENCOUNTER — Encounter: Payer: Self-pay | Admitting: *Deleted

## 2013-03-25 ENCOUNTER — Ambulatory Visit (INDEPENDENT_AMBULATORY_CARE_PROVIDER_SITE_OTHER): Payer: BC Managed Care – PPO | Admitting: Obstetrics and Gynecology

## 2013-03-25 VITALS — BP 110/64 | Ht 61.0 in | Wt 160.0 lb

## 2013-03-25 DIAGNOSIS — Z01419 Encounter for gynecological examination (general) (routine) without abnormal findings: Secondary | ICD-10-CM

## 2013-03-25 MED ORDER — ERGOCALCIFEROL 1.25 MG (50000 UT) PO CAPS: 50000.0000 [IU] | ORAL_CAPSULE | ORAL | Status: DC

## 2013-03-25 MED ORDER — ESTRADIOL 0.05 MG/24HR TD PTTW: MEDICATED_PATCH | TRANSDERMAL | Status: DC

## 2013-03-25 NOTE — Progress Notes (Signed)
62 y.o.   Married    Caucasian   female   G2P2   here for annual exam.  Had eye surgery recently for a growth over the iris, required a graft from another part of her eyeball.    No LMP recorded. Patient has had a hysterectomy.          Sexually active: yes  The current method of family planning is tubal ligation, status post hysterectomy and post menopausal status.    Exercising: walk 5-7 days a week  Last mammogram:  04/14/12 normal Last pap smear:2003 normal History of abnormal pap: no Smoking:never Alcohol: 4-5 glasses of wine a week Last colonoscopy: 2013 normal, repeat in 5 years due to family history Last Bone Density:  never Last tetanus shot: 2013 severe reaction(swelling) Last cholesterol check: 12/2012 slightly elevated  Hgb:    pcp            Urine: pcp   Family History  Problem Relation Age of Onset  . Colon cancer Father 8  . Diabetes Mother   . Hypertension Mother     Patient Active Problem List   Diagnosis Date Noted  . LIVER HEMANGIOMA 11/10/2008  . NAUSEA AND VOMITING 11/10/2008  . Abdominal pain, right upper quadrant 11/10/2008  . ABNORMAL FINDINGS GI TRACT 11/10/2008  . HYPERLIPIDEMIA 11/09/2008  . ALLERGIC RHINITIS CAUSE UNSPECIFIED 11/09/2008  . RENAL CALCULUS 11/09/2008  . DYSPHAGIA UNSPECIFIED 11/09/2008  . COLONIC POLYPS 03/21/2005  . ESOPHAGEAL STRICTURE 03/21/2005  . DIVERTICULOSIS, COLON 03/21/2005    Past Medical History  Diagnosis Date  . Hypertension   . Stricture esophagus   . Hemangioma of liver   . Depression   . Cystitis, interstitial     Past Surgical History  Procedure Laterality Date  . Liver resection  2010    1/2 liver removed at [facility] for hemangioma  . Total abdominal hysterectomy    . Appendectomy    . Cholecystectomy  2010    during liver sx  . Colonoscopy    . Upper gastrointestinal endoscopy    . Tubal ligation    . Eye surgery  10/2012    fatty tissue removed and graft from eyeball    Allergies:  Nickel  Current Outpatient Prescriptions  Medication Sig Dispense Refill  . doxycycline (VIBRA-TABS) 100 MG tablet Take 1 tablet by mouth as needed.      . ergocalciferol (VITAMIN D2) 50000 UNITS capsule Take 1 capsule (50,000 Units total) by mouth once a week.  4 capsule  0  . hydrochlorothiazide (HYDRODIURIL) 25 MG tablet Take 1 tablet by mouth Daily.      Marland Kitchen MINIVELLE 0.05 MG/24HR APPLY ONE PATCH TWICE A WEEK AS DIRECTED  8 patch  0  . omeprazole (PRILOSEC) 20 MG capsule TAKE 1 CAPSULE (20 MG TOTAL) BY MOUTH DAILY.  30 capsule  0  . ZETIA 10 MG tablet Take 1 tablet by mouth Daily.       No current facility-administered medications for this visit.  doxycycline prn for rosacea  ROS: Pertinent items are noted in HPI.  Social Hx:  Married, two children.  Works as an Sports administrator.    Exam:    BP 110/64  Ht 5\' 1"  (1.549 m)  Wt 160 lb (72.576 kg)  BMI 30.25 kg/m2  Ht stable and wt up 4 pounds from last year Wt Readings from Last 3 Encounters:  03/25/13 160 lb (72.576 kg)  03/12/12 150 lb (68.04 kg)  03/07/12 155 lb (  70.308 kg)     Ht Readings from Last 3 Encounters:  03/25/13 5\' 1"  (1.549 m)  03/12/12 5\' 1"  (1.549 m)  03/07/12 5\' 1"  (1.549 m)    General appearance: alert, cooperative and appears stated age Head: Normocephalic, without obvious abnormality, atraumatic Neck: no adenopathy, supple, symmetrical, trachea midline and thyroid not enlarged, symmetric, no tenderness/mass/nodules Lungs: clear to auscultation bilaterally Breasts: Inspection negative, No nipple retraction or dimpling, No nipple discharge or bleeding, No axillary or supraclavicular adenopathy, Normal to palpation without dominant masses Heart: regular rate and rhythm Abdomen: soft, non-tender; bowel sounds normal; no masses,  no organomegaly Extremities: extremities normal, atraumatic, no cyanosis or edema Skin: Skin color, texture, turgor normal. No rashes or lesions Lymph nodes: Cervical,  supraclavicular, and axillary nodes normal. No abnormal inguinal nodes palpated Neurologic: Grossly normal   Pelvic: External genitalia:  no lesions              Urethra:  normal appearing urethra with no masses, tenderness or lesions              Bartholins and Skenes: normal                 Vagina: normal appearing vagina with normal color and discharge, no lesions              Cervix: absent              Pap taken: no        Bimanual Exam:  Uterus:  absent                                      Adnexa: normal adnexa in size, nontender and no masses                                      Rectovaginal: Confirms                                      Anus:  normal sphincter tone, no lesions  A: normal menopausal exam, on ERT     P: mammogram counseled on breast self exam, mammography screening, adequate intake of calcium and vitamin D, diet and exercise return annually or prn   RF Minivelle 0.05 mg 2 x per week and Vit d.  See orders.  An After Visit Summary was printed and given to the patient.

## 2013-03-25 NOTE — Patient Instructions (Addendum)

## 2013-03-28 ENCOUNTER — Other Ambulatory Visit: Payer: Self-pay | Admitting: Internal Medicine

## 2013-04-02 ENCOUNTER — Other Ambulatory Visit: Payer: Self-pay | Admitting: Obstetrics and Gynecology

## 2013-04-02 DIAGNOSIS — Z1231 Encounter for screening mammogram for malignant neoplasm of breast: Secondary | ICD-10-CM

## 2013-04-23 ENCOUNTER — Ambulatory Visit (HOSPITAL_COMMUNITY)
Admission: RE | Admit: 2013-04-23 | Discharge: 2013-04-23 | Disposition: A | Discharge status: Home / Self Care | Payer: BC Managed Care – PPO | Source: Ambulatory Visit | Attending: Obstetrics and Gynecology | Admitting: Obstetrics and Gynecology

## 2013-04-23 DIAGNOSIS — Z1231 Encounter for screening mammogram for malignant neoplasm of breast: Secondary | ICD-10-CM | POA: Insufficient documentation

## 2014-03-18 ENCOUNTER — Other Ambulatory Visit: Payer: Self-pay | Admitting: Obstetrics and Gynecology

## 2014-03-18 MED ORDER — ERGOCALCIFEROL 1.25 MG (50000 UT) PO CAPS: 50000.0000 [IU] | ORAL_CAPSULE | ORAL | Status: DC

## 2014-03-18 NOTE — Telephone Encounter (Signed)
Last AEX 03/25/13 Last refill 02/16/13 #4/ 12 refills Last Vitamin D check 2011 = 34  Please approve or deny Rx.

## 2014-03-18 NOTE — Telephone Encounter (Signed)
Pharmacy calling for refill on vit d 50,000 units for patient.

## 2014-03-23 ENCOUNTER — Other Ambulatory Visit: Payer: Self-pay

## 2014-03-23 ENCOUNTER — Telehealth: Payer: Self-pay | Admitting: Obstetrics and Gynecology

## 2014-03-23 MED ORDER — ERGOCALCIFEROL 1.25 MG (50000 UT) PO CAPS: 50000.0000 [IU] | ORAL_CAPSULE | ORAL | Status: DC

## 2014-03-23 NOTE — Telephone Encounter (Signed)
Rx reordered to [facility] in [facility], patient switching pharmacies//kn

## 2014-03-23 NOTE — Telephone Encounter (Signed)
Calling pt to confirm appt. vm full unable to leave message

## 2014-03-24 NOTE — Telephone Encounter (Signed)
°  Calling pt to confirm appt. vm full unable to leave message

## 2014-03-29 ENCOUNTER — Ambulatory Visit (INDEPENDENT_AMBULATORY_CARE_PROVIDER_SITE_OTHER): Payer: BC Managed Care – PPO | Admitting: Obstetrics and Gynecology

## 2014-03-29 ENCOUNTER — Encounter: Payer: Self-pay | Admitting: Obstetrics and Gynecology

## 2014-03-29 ENCOUNTER — Ambulatory Visit: Payer: BC Managed Care – PPO | Admitting: Obstetrics and Gynecology

## 2014-03-29 VITALS — BP 122/70 | HR 64 | Ht 61.0 in | Wt 162.7 lb

## 2014-03-29 DIAGNOSIS — Z13820 Encounter for screening for osteoporosis: Secondary | ICD-10-CM

## 2014-03-29 DIAGNOSIS — Z01419 Encounter for gynecological examination (general) (routine) without abnormal findings: Secondary | ICD-10-CM

## 2014-03-29 DIAGNOSIS — E559 Vitamin D deficiency, unspecified: Secondary | ICD-10-CM

## 2014-03-29 MED ORDER — ERGOCALCIFEROL 1.25 MG (50000 UT) PO CAPS: ORAL_CAPSULE | ORAL | Status: DC

## 2014-03-29 MED ORDER — ESTRADIOL 0.0375 MG/24HR TD PTTW
1.0000 | MEDICATED_PATCH | TRANSDERMAL | Status: DC
Start: 2014-03-29 — End: 2014-03-30

## 2014-03-29 NOTE — Progress Notes (Signed)
Patient ID: Kelly Lozano, female   DOB: [birth year], 63 y.o.   MRN: [id number] GYNECOLOGY VISIT  PCP:   Asencion Noble, MD  Referring provider:   HPI: 63 y.o.   Married  Caucasian  female   G2P2 with No LMP recorded. Patient has had a hysterectomy.   here for  AEX. Has reduced the Minivelle over time. Uses the patch only once a week.   Hgb:    PCP Urine:  Neg  GYNECOLOGIC HISTORY: No LMP recorded. Patient has had a hysterectomy. Sexually active:  yes Partner preference: female Contraception:  Postmenopausal/TAH/BSO  Menopausal hormone therapy: Minivelle 0.05 mg. DES exposure:  no Blood transfusions:   no Sexually transmitted diseases:   no GYN procedures and prior surgeries:  TAH/BSO at about 63 years old - endometriosis. Dr. Ardine Eng.  Last mammogram:     04-23-13 wnl:[facility]            Last pap and high risk HPV testing:   2003 wnl History of abnormal pap smear:  no   OB History   Grav Para Term Preterm Abortions TAB SAB Ect Mult Living   2 2        2        LIFESTYLE: Exercise:    walking           Tobacco:   no Alcohol:     1 drink per week Drug use:  no  OTHER HEALTH MAINTENANCE: Tetanus/TDap:   Allergic to Gardisil:              n/a Influenza:            07/2013 Zostavax:             Completed with PCP  Bone density:       never Colonoscopy:     03-12-12 normal with Dr. Delfin Edis.  Next colonoscopy due 02/2017.  Cholesterol check: Within normal range with medication.  Family History  Problem Relation Age of Onset  . Colon cancer Father 64  . Diabetes Mother   . Hypertension Mother     Patient Active Problem List   Diagnosis Date Noted  . LIVER HEMANGIOMA 11/10/2008  . NAUSEA AND VOMITING 11/10/2008  . Abdominal pain, right upper quadrant 11/10/2008  . ABNORMAL FINDINGS GI TRACT 11/10/2008  . HYPERLIPIDEMIA 11/09/2008  . ALLERGIC RHINITIS CAUSE UNSPECIFIED 11/09/2008  . RENAL CALCULUS 11/09/2008  . DYSPHAGIA UNSPECIFIED 11/09/2008   . COLONIC POLYPS 03/21/2005  . ESOPHAGEAL STRICTURE 03/21/2005  . DIVERTICULOSIS, COLON 03/21/2005   Past Medical History  Diagnosis Date  . Hypertension   . Stricture esophagus   . Hemangioma of liver   . Depression   . Cystitis, interstitial   . Elevated cholesterol   . Kidney stones     Past Surgical History  Procedure Laterality Date  . Liver resection  2010    1/2 liver removed at [facility] for hemangioma  . Total abdominal hysterectomy    . Appendectomy    . Cholecystectomy  2010    during liver sx  . Colonoscopy    . Upper gastrointestinal endoscopy    . Tubal ligation    . Eye surgery  10/2012    fatty tissue removed and graft from eyeball  . Basil cell excised left arm  2012 Left   . Augmentation mammaplasty      ALLERGIES: Nickel and Tdap  Current Outpatient Prescriptions  Medication Sig Dispense Refill  . doxycycline (VIBRA-TABS) 100 MG tablet  Take 1 tablet by mouth as needed.      . ergocalciferol (VITAMIN D2) 50000 UNITS capsule Take 1 capsule (50,000 Units total) by mouth once a week.  4 capsule  0  . estradiol (MINIVELLE) 0.05 MG/24HR APPLY ONE PATCH TWICE A WEEK AS DIRECTED  8 patch  12  . hydrochlorothiazide (HYDRODIURIL) 25 MG tablet Take 1 tablet by mouth Daily.      Marland Kitchen omeprazole (PRILOSEC) 20 MG capsule TAKE 1 CAPSULE (20 MG TOTAL) BY MOUTH DAILY.  30 capsule  11  . ZETIA 10 MG tablet Take 1 tablet by mouth Daily.       No current facility-administered medications for this visit.     ROS:  Pertinent items are noted in HPI.  SOCIAL HISTORY:  Retired.  Occupational nurse.  Husband is a Company secretary.  PHYSICAL EXAMINATION:    BP 122/70  Pulse 64  Ht 5\' 1"  (1.549 m)  Wt 162 lb 11.2 oz (73.8 kg)  BMI 30.76 kg/m2   Wt Readings from Last 3 Encounters:  03/29/14 162 lb 11.2 oz (73.8 kg)  03/25/13 160 lb (72.576 kg)  03/12/12 150 lb (68.04 kg)     Ht Readings from Last 3 Encounters:  03/29/14 5\' 1"  (1.549 m)  03/25/13 5\' 1"  (1.549 m)  03/12/12 5'  1" (1.549 m)    General appearance: alert, cooperative and appears stated age Head: Normocephalic, without obvious abnormality, atraumatic Neck: no adenopathy, supple, symmetrical, trachea midline and thyroid not enlarged, symmetric, no tenderness/mass/nodules Lungs: clear to auscultation bilaterally Breasts: Bilateral implants, No nipple retraction or dimpling, No nipple discharge or bleeding, No axillary or supraclavicular adenopathy, Normal to palpation without dominant masses Heart: regular rate and rhythm Abdomen: right subcostal scar, umbilical scar, Pfannenstiel incision, right lower quadrant scar; soft, non-tender; no masses,  no organomegaly Extremities: extremities normal, atraumatic, no cyanosis or edema Skin: Skin color, texture, turgor normal. No rashes or lesions Lymph nodes: Cervical, supraclavicular, and axillary nodes normal. No abnormal inguinal nodes palpated Neurologic: Grossly normal  Pelvic: External genitalia:  no lesions              Urethra:  normal appearing urethra with no masses, tenderness or lesions              Bartholins and Skenes: normal                 Vagina: normal appearing vagina with normal color and discharge, no lesions              Cervix:  Absent.              Pap and high risk HPV testing done: no.            Bimanual Exam:  Uterus:  uterus is absent.                                      Adnexa: no masses                                      Rectovaginal: Confirms                                      Anus:  normal sphincter tone, no lesions  ASSESSMENT  Normal gynecologic exam. Status post TAH/BSO. ERT patient.   PLAN  Mammogram recommended yearly.  Pap smear and high risk HPV testing not indicated.  Will reduce Minivelle to 0.0375 mg twice a week.  Rx for one year.  See orders. I discussed risks of DVT, PE, Mi, stroke, breast cancer.  Check Vit D level. Rx for Vit D 50,000 units every other week. #8, RF 3. Bone density.   Counseled on self breast exam, Calcium and vitamin D intake, exercise. Return annually or prn   An After Visit Summary was printed and given to the patient.

## 2014-03-29 NOTE — Patient Instructions (Signed)

## 2014-03-30 ENCOUNTER — Telehealth: Payer: Self-pay | Admitting: Obstetrics and Gynecology

## 2014-03-30 LAB — VITAMIN D 25 HYDROXY (VIT D DEFICIENCY, FRACTURES): VIT D 25 HYDROXY: 63 ng/mL (ref 30–89)

## 2014-03-30 MED ORDER — ERGOCALCIFEROL 1.25 MG (50000 UT) PO CAPS: ORAL_CAPSULE | ORAL | Status: DC

## 2014-03-30 MED ORDER — ESTRADIOL 0.0375 MG/24HR TD PTTW: 1.0000 | MEDICATED_PATCH | TRANSDERMAL | Status: DC

## 2014-03-30 NOTE — Telephone Encounter (Signed)
Attempted to reach patient at number provided [phone]. No answer and the voicemail box is full. Will try to reach patient later to discuss message from [place].

## 2014-03-30 NOTE — Telephone Encounter (Signed)
The vitamin d rx was accidentally deleted can we please resend. Also it ws only sent for 3 week supply is this all we wanted to send?  The minivelle also not sent for long wanted some clarification about it

## 2014-03-30 NOTE — Telephone Encounter (Signed)
Spoke with Marita Kansas at [facility]. Advised rx for Vitamin D 50,000 units resent. Advised rx is to be taken every other week #8 with 3RF. Advised minivelle was supposed to be filled for one year per Dr.Silva's office note. New rx sent for Minivelle 0.0375mg  twice weekly #16 with 6RF. Marita [place] is agreeable and verbalizes understanding.  Routing to provider for final review. Patient agreeable to disposition. Will close encounter.

## 2014-03-30 NOTE — Telephone Encounter (Signed)
Please have the patient discontinue her vitamin D.   Her level is 63, which is above the goal of 30 - 50.  We were giving her a short term prescription until the level was back. Now, I can recommend that she stop it entirely. She may take over the counter vitamin D 600 IU daily.   Thanks for resending the Minivelle 0.0375 mg twice weekly. It was accidentally deleted.

## 2014-03-31 NOTE — Telephone Encounter (Signed)
Attempted to reach patient again at number provided [phone]. No answer and the voicemail box is full. Will try to reach patient again to discuss message from [place].

## 2014-04-02 NOTE — Telephone Encounter (Signed)
Attempted to reach patient at number provided [phone]. No answer and the voicemail box is full .Was able to press prompt to leave number for call back. Will try to reach patient again to discuss message from [place].

## 2014-04-06 NOTE — Telephone Encounter (Addendum)
Attempted to reach patient at number provided [phone]. No answer and the voicemail box is currently full. Was able to press prompt to leave number to call back. Mychart message sent to patient with vitamin d instructions from [place].

## 2014-04-19 NOTE — Telephone Encounter (Signed)
Dr.Silva, I have attempted to reach patient by phone four times but have been unable to reach her. Mychart message sent on 6/9 to patient with results and instructions. Okay to close encounter?

## 2014-04-19 NOTE — Telephone Encounter (Addendum)
Letter sent to patient with results and recommendations.   Routing to provider for final review. Patient agreeable to disposition. Will close encounter

## 2014-04-19 NOTE — Telephone Encounter (Signed)
Patient will need a letter with her result and my recommendations. Can be unregistered.

## 2014-05-03 ENCOUNTER — Other Ambulatory Visit: Payer: Self-pay | Admitting: Obstetrics and Gynecology

## 2014-05-03 DIAGNOSIS — Z1231 Encounter for screening mammogram for malignant neoplasm of breast: Secondary | ICD-10-CM

## 2014-05-18 ENCOUNTER — Ambulatory Visit (HOSPITAL_COMMUNITY)
Admission: RE | Admit: 2014-05-18 | Discharge: 2014-05-18 | Disposition: A | Discharge status: Home / Self Care | Payer: BC Managed Care – PPO | Source: Ambulatory Visit | Attending: Obstetrics and Gynecology | Admitting: Obstetrics and Gynecology

## 2014-05-18 ENCOUNTER — Other Ambulatory Visit: Payer: Self-pay | Admitting: Obstetrics and Gynecology

## 2014-05-18 DIAGNOSIS — Z13820 Encounter for screening for osteoporosis: Secondary | ICD-10-CM | POA: Insufficient documentation

## 2014-05-18 DIAGNOSIS — Z780 Asymptomatic menopausal state: Secondary | ICD-10-CM | POA: Insufficient documentation

## 2014-05-18 DIAGNOSIS — Z978 Presence of other specified devices: Secondary | ICD-10-CM | POA: Insufficient documentation

## 2014-05-18 DIAGNOSIS — Z1231 Encounter for screening mammogram for malignant neoplasm of breast: Secondary | ICD-10-CM | POA: Insufficient documentation

## 2014-07-12 ENCOUNTER — Encounter: Payer: Self-pay | Admitting: Internal Medicine

## 2014-08-30 ENCOUNTER — Encounter: Payer: Self-pay | Admitting: Obstetrics and Gynecology

## 2015-02-14 ENCOUNTER — Encounter: Payer: Self-pay | Admitting: Obstetrics and Gynecology

## 2015-02-14 ENCOUNTER — Telehealth: Payer: Self-pay | Admitting: Obstetrics and Gynecology

## 2015-02-14 NOTE — Telephone Encounter (Signed)
Provider cx. Called pt. No answer and voicemail not set up. Unable to leave message for pt to cb and rs. Also attempted at listed work number (reached voicemail of another individual) and spouse (listed contact person) with no answer or voicemail. Recall entered. Letter mailed.

## 2015-04-01 ENCOUNTER — Encounter: Payer: Self-pay | Admitting: Obstetrics and Gynecology

## 2015-04-01 ENCOUNTER — Ambulatory Visit (INDEPENDENT_AMBULATORY_CARE_PROVIDER_SITE_OTHER): Payer: BLUE CROSS/BLUE SHIELD | Admitting: Obstetrics and Gynecology

## 2015-04-01 VITALS — BP 118/80 | HR 64 | Resp 16 | Ht 61.0 in | Wt 158.0 lb

## 2015-04-01 DIAGNOSIS — E559 Vitamin D deficiency, unspecified: Secondary | ICD-10-CM

## 2015-04-01 NOTE — Progress Notes (Signed)
Patient ID: Kelly Lozano, female   DOB: [birth year], 64 y.o.   MRN: [id number] 64 y.o. G2P2 Married Caucasian female here for annual exam.    Is retired.  Is an Training and development officer.   Patient is on ERT. Forgets to put the patch on sometimes.  No hot flashes if she forgets to use.  Will stopping.  PCP:   Dr. Asencion Noble  No LMP recorded. Patient has had a hysterectomy.          Sexually active: Yes.    The current method of family planning is status post hysterectomy.    Exercising: Yes.    Walking Smoker:  no  Health Maintenance: Pap:  2003 WNL  History of abnormal Pap:  no MMG:  05-18-14 3D Extremely dense WNL Colonoscopy:  03-12-12 mild diverticulosis repeat in 5 years BMD:   05-18-14 Osteopenia of hip and spine TDaP:  Allergic to Screening Labs: PCP Hb today: PCP,  Urine today: PCP   reports that she has never smoked. She has never used smokeless tobacco. She reports that she drinks about 0.6 oz of alcohol per week. She reports that she does not use illicit drugs.  Past Medical History  Diagnosis Date  . Hypertension   . Stricture esophagus   . Hemangioma of liver   . Depression   . Cystitis, interstitial   . Elevated cholesterol   . Kidney stones     Past Surgical History  Procedure Laterality Date  . Liver resection  2010    1/2 liver removed at [facility] for hemangioma  . Total abdominal hysterectomy    . Appendectomy    . Cholecystectomy  2010    during liver sx  . Colonoscopy    . Upper gastrointestinal endoscopy    . Tubal ligation    . Eye surgery  10/2012    fatty tissue removed and graft from eyeball  . Basil cell excised left arm  2012 Left   . Augmentation mammaplasty      Current Outpatient Prescriptions  Medication Sig Dispense Refill  . doxycycline (VIBRA-TABS) 100 MG tablet Take 1 tablet by mouth as needed.    . ergocalciferol (VITAMIN D2) 50000 UNITS capsule Take one capsule (50,000 units) every other week. 8 capsule 3  . estradiol (MINIVELLE) 0.0375 MG/24HR  Place 1 patch onto the skin 2 (two) times a week. Apply anywhere on lower abdomen.  Change patch on Sun am and Wed pm. 16 patch 6  . hydrochlorothiazide (HYDRODIURIL) 25 MG tablet Take 1 tablet by mouth Daily.    Marland Kitchen omeprazole (PRILOSEC) 20 MG capsule TAKE 1 CAPSULE (20 MG TOTAL) BY MOUTH DAILY. 30 capsule 11  . ZETIA 10 MG tablet Take 1 tablet by mouth Daily.     No current facility-administered medications for this visit.    Family History  Problem Relation Age of Onset  . Colon cancer Father 60  . Diabetes Mother   . Hypertension Mother     ROS:  Pertinent items are noted in HPI.  Otherwise, a comprehensive ROS was negative.  Exam:   There were no vitals taken for this visit.    General appearance: alert, cooperative and appears stated age Head: Normocephalic, without obvious abnormality, atraumatic Neck: no adenopathy, supple, symmetrical, trachea midline and thyroid normal to inspection and palpation Lungs: clear to auscultation bilaterally Breasts: normal appearance, no masses or tenderness, Inspection negative, No nipple retraction or dimpling, No nipple discharge or bleeding, No axillary or supraclavicular adenopathy, Bilateral implants. Heart:  regular rate and rhythm Abdomen: soft, non-tender; bowel sounds normal; no masses,  no organomegaly Extremities: extremities normal, atraumatic, no cyanosis or edema Skin: Skin color, texture, turgor normal. No rashes or lesions Lymph nodes: Cervical, supraclavicular, and axillary nodes normal. No abnormal inguinal nodes palpated Neurologic: Grossly normal  Pelvic: External genitalia:  no lesions              Urethra:  normal appearing urethra with no masses, tenderness or lesions              Bartholins and Skenes: normal                 Vagina: normal appearing vagina with normal color and discharge, no lesions              Cervix: absent              Pap taken: No. Bimanual Exam:  Uterus:  uterus absent              Adnexa:  normal adnexa and no mass, fullness, tenderness              Rectovaginal: Yes.  .  Confirms.              Anus:  normal sphincter tone, no lesions  Chaperone was present for exam.  Assessment:   Well woman visit with normal exam. Status post TAH.  ERT patient.   Osteopenia.  History of low Vit D.  On prescription vit D.  Plan: Yearly mammogram recommended after age 61.  Recommended self breast exam.  Pap and HR HPV as above. ERT discussed with patient - risks and benefits.  Discussed risks of DTV, PE, MI, stroke, breast CA. Will stop. Discussed Calcium, Vitamin D, regular exercise program including cardiovascular and weight bearing exercise. Labs performed.  Yes.  .   See orders. Vit D. Refills given on medications.  No..   No refill of ERT or of vit D.   If has recurrent hot flashes, may call to restart ERT. Follow up annually and prn.      After visit summary provided.

## 2015-04-01 NOTE — Patient Instructions (Signed)

## 2015-04-02 ENCOUNTER — Other Ambulatory Visit: Payer: Self-pay | Admitting: Obstetrics and Gynecology

## 2015-04-02 LAB — VITAMIN D 25 HYDROXY (VIT D DEFICIENCY, FRACTURES): VIT D 25 HYDROXY: 36 ng/mL (ref 30–100)

## 2015-04-02 MED ORDER — ERGOCALCIFEROL 1.25 MG (50000 UT) PO CAPS: ORAL_CAPSULE | ORAL | Status: DC

## 2015-04-08 ENCOUNTER — Ambulatory Visit: Payer: BC Managed Care – PPO | Admitting: Obstetrics and Gynecology

## 2015-04-20 ENCOUNTER — Other Ambulatory Visit: Payer: Self-pay | Admitting: Obstetrics and Gynecology

## 2015-04-20 DIAGNOSIS — Z1231 Encounter for screening mammogram for malignant neoplasm of breast: Secondary | ICD-10-CM

## 2015-05-20 ENCOUNTER — Ambulatory Visit (HOSPITAL_COMMUNITY): Payer: BC Managed Care – PPO

## 2015-05-26 ENCOUNTER — Other Ambulatory Visit: Payer: Self-pay | Admitting: Obstetrics and Gynecology

## 2015-05-26 ENCOUNTER — Ambulatory Visit (HOSPITAL_COMMUNITY)
Admission: RE | Admit: 2015-05-26 | Discharge: 2015-05-26 | Disposition: A | Discharge status: Home / Self Care | Payer: BLUE CROSS/BLUE SHIELD | Source: Ambulatory Visit | Attending: Obstetrics and Gynecology | Admitting: Obstetrics and Gynecology

## 2015-05-26 DIAGNOSIS — Z1231 Encounter for screening mammogram for malignant neoplasm of breast: Secondary | ICD-10-CM | POA: Insufficient documentation

## 2015-07-18 ENCOUNTER — Encounter: Payer: Self-pay | Admitting: Obstetrics and Gynecology

## 2015-07-18 ENCOUNTER — Telehealth: Payer: Self-pay | Admitting: Emergency Medicine

## 2015-07-18 ENCOUNTER — Other Ambulatory Visit: Payer: Self-pay | Admitting: Obstetrics and Gynecology

## 2015-07-18 MED ORDER — ESTRADIOL 0.0375 MG/24HR TD PTTW: 1.0000 | MEDICATED_PATCH | TRANSDERMAL | Status: DC

## 2015-07-18 NOTE — Telephone Encounter (Signed)
Chief Complaint  Patient presents with  . Medication Management    Patient sent mychart message    ===View-only below this line===   ----- Message -----    From: Terri Skains    Sent: 07/18/2015 10:55 AM EDT      To: Arloa Koh, MD Subject: Non-Urgent Medical Question  Dr. Quincy Simmonds,  I tried being off the minivelle, but it did not work I have continued having hot flashes I had a box left from my last prescription and starting applying them again.  Please send a prescription of minivelle 0.0375 to [id number] on [address] [place]   [phone]   Please let me know when you have called in the prescription.   Thanks so much.  Brennan Bailey

## 2015-07-18 NOTE — Telephone Encounter (Signed)
I have sent refills of the Newborn to her pharmacy until her annual exam is due in June 2017.  Please inform patient.

## 2015-07-18 NOTE — Telephone Encounter (Signed)
Telephone call for triage created to discuss message with patient and disposition as appropriate.   

## 2015-07-18 NOTE — Telephone Encounter (Signed)
Dr. Quincy Simmonds, can you review and advise?  Patient with History of TAH.  Mammogram 05/26/15 Bi-rads 1

## 2015-07-19 NOTE — Telephone Encounter (Signed)
Dr.Silva sent mychart response to patient and patient has read message.  Will close encounter.

## 2016-02-15 ENCOUNTER — Other Ambulatory Visit: Payer: Self-pay | Admitting: *Deleted

## 2016-02-15 MED ORDER — ERGOCALCIFEROL 1.25 MG (50000 UT) PO CAPS: ORAL_CAPSULE | ORAL | Status: DC

## 2016-02-15 NOTE — Telephone Encounter (Addendum)
Medication refill request from [facility]: Vitamin D 50,000 iu's  Last AEX:  04/01/15 with BS  Next AEX: 04/25/16 with BS  Last MMG (if hormonal medication request): n/a Refill authorized: #12

## 2016-03-06 ENCOUNTER — Other Ambulatory Visit: Payer: Self-pay | Admitting: Obstetrics and Gynecology

## 2016-03-06 NOTE — Telephone Encounter (Signed)
Medication refill request: Vitamin D  Last AEX:  04-01-15  Next AEX: 04-25-16 Last MMG (if hormonal medication request): 05-26-15 WNL Refill authorized: please advise

## 2016-04-25 ENCOUNTER — Ambulatory Visit: Payer: BLUE CROSS/BLUE SHIELD | Admitting: Obstetrics and Gynecology

## 2016-06-21 ENCOUNTER — Other Ambulatory Visit: Payer: Self-pay | Admitting: Obstetrics and Gynecology

## 2016-06-21 ENCOUNTER — Telehealth: Payer: Self-pay | Admitting: Obstetrics and Gynecology

## 2016-06-21 MED ORDER — VITAMIN D (ERGOCALCIFEROL) 1.25 MG (50000 UNIT) PO CAPS: ORAL_CAPSULE | ORAL | 0 refills | Status: DC

## 2016-06-21 NOTE — Telephone Encounter (Signed)
Left message for patient to return call. -sco

## 2016-06-21 NOTE — Telephone Encounter (Signed)
Patient returned a call to the nurse about a refill request. The encounter was closed already. The patient said she is traveling out [place] and cannot come in for a visit until January. She's like her vitamin D called into the pharmacy on file (she can pick it up anywhere) to last until her next appointment in January 2018.

## 2016-06-21 NOTE — Telephone Encounter (Signed)
Rx sent to her pharmacy by me.  I gave refills until annual exam in January.  Thanks!

## 2016-06-21 NOTE — Telephone Encounter (Signed)
Patient returned a call to the nurse about a refill request. The encounter was closed already. The patient said she is traveling out [place] and cannot come in for a visit until January. She'd like her vitamin D called into the pharmacy on file (she can pick it up anywhere) to last until her next appointment in January 2018. Please advise. Thank you.

## 2016-06-21 NOTE — Telephone Encounter (Signed)
Medication refill request: Vitamin D Last AEX:  03/25/13 Dr. Joan Flores Next AEX: 11/05/16 BS Last MMG (if hormonal medication request): 05/26/15 BIRADS1 Density C Refill authorized: 03/06/16 #8 0R. Please advise. Thank you.

## 2016-06-21 NOTE — Telephone Encounter (Signed)
Rx declined for vit D. Please move up appointment for annual exam.  She is overdue.  Needs vit D level rechecked.

## 2016-09-11 ENCOUNTER — Other Ambulatory Visit: Payer: Self-pay | Admitting: Obstetrics and Gynecology

## 2016-09-11 NOTE — Telephone Encounter (Signed)
Left voicemail for patient to call back to make lab appt for vitamin D recheck.

## 2016-09-11 NOTE — Telephone Encounter (Signed)
Medication refill request: Vitamin D Last AEX:  04/01/15 Next AEX: 11/05/16 Dr. Quincy Simmonds  Last MMG (if hormonal medication request): 05/31/15 BIRADS1:neg  Refill authorized: 06/21/16 #10caps/0R. Today please advise.   Routed to [place]  Dr. Quincy Simmonds out of the office today

## 2016-09-11 NOTE — Telephone Encounter (Signed)
She really needs another vit d level prior to renewing her prescription. Please call and schedule a lab visit for a vit D level , she is also overdue for an annual exam with Dr Quincy Simmonds.

## 2016-10-29 DIAGNOSIS — M8580 Other specified disorders of bone density and structure, unspecified site: Secondary | ICD-10-CM

## 2016-10-29 HISTORY — DX: Other specified disorders of bone density and structure, unspecified site: M85.80

## 2016-10-29 HISTORY — PX: COLONOSCOPY: SHX174

## 2016-11-02 NOTE — Progress Notes (Signed)
66 y.o. G2P2 Married Caucasian female here for annual exam.    Is off of her ERT for 6 months.  Hot flashes are decreasing.   Took Vit D last time was last week. Needs to have a level checked.  Overdue for her mammogram?  Like to ski in [place].  Retired in Jan. This year.  PCP:   Asencion Noble, MD  No LMP recorded. Patient has had a hysterectomy.           Sexually active: No. female The current method of family planning is tubal ligation/Hysterectomy.    Exercising: Yes.    Walks 3x/week Smoker:  no  Health Maintenance: Pap: 2003 normal  History of abnormal Pap:  no MMG: ??[id number] Bil.silicone [id number] Density C/Neg/BiRads1:TWH Colonoscopy:  03-02-12 diverticulosis Dr.Brodie;next due 02/2017 due to family hx colon cancer in a parent. BMD:  05-18-14  Result  Mild osteopenia of hip and spine:[facility].   TDaP:  2013 SEVERE REACTION (swelling) Gardasil:   N/A Hep C:  Will d/w PCP. Screening Labs:  Hb today: PCP, Urine today: unable to void   reports that she has never smoked. She has never used smokeless tobacco. She reports that she does not drink alcohol or use drugs.  Past Medical History:  Diagnosis Date  . Cystitis, interstitial   . Depression   . Elevated cholesterol   . Hemangioma of liver   . Hypertension   . Kidney stones   . Stricture esophagus     Past Surgical History:  Procedure Laterality Date  . APPENDECTOMY    . AUGMENTATION MAMMAPLASTY    . basil cell excised left arm  2012 Left   . CHOLECYSTECTOMY  2010   during liver sx  . COLONOSCOPY    . EYE SURGERY  10/2012   fatty tissue removed and graft from eyeball  . LIVER RESECTION  2010   1/2 liver removed at [facility] for hemangioma  . TOTAL ABDOMINAL HYSTERECTOMY    . TUBAL LIGATION    . UPPER GASTROINTESTINAL ENDOSCOPY      Current Outpatient Prescriptions  Medication Sig Dispense Refill  . doxycycline (VIBRA-TABS) 100 MG tablet Take 1 tablet by mouth as needed.    . hydrochlorothiazide (HYDRODIURIL)  25 MG tablet Take 1 tablet by mouth Daily.    Marland Kitchen omeprazole (PRILOSEC) 20 MG capsule TAKE 1 CAPSULE (20 MG TOTAL) BY MOUTH DAILY. 30 capsule 11  . Vitamin D, Ergocalciferol, (DRISDOL) 50000 units CAPS capsule TAKE 1 CAPSULE BY MOUTH EVERY OTHER WEEK 10 capsule 0  . ZETIA 10 MG tablet Take 1 tablet by mouth Daily.    Marland Kitchen estradiol (MINIVELLE) 0.0375 MG/24HR Place 1 patch onto the skin 2 (two) times a week. Apply anywhere on lower abdomen.  Change patch on Sun am and Wed pm. (Patient not taking: Reported on 11/05/2016) 16 patch 4   No current facility-administered medications for this visit.     Family History  Problem Relation Age of Onset  . Colon cancer Father 49  . Diabetes Mother   . Hypertension Mother     ROS:  Pertinent items are noted in HPI.  Otherwise, a comprehensive ROS was negative.  Exam:   BP (!) 142/80 (BP Location: Right Arm, Patient Position: Sitting, Cuff Size: Normal)   Pulse 70   Resp 20   Ht 5' 0.5" (1.537 m)   Wt 158 lb 12.8 oz (72 kg)   BMI 30.50 kg/m     General appearance: alert, cooperative and appears stated age  Head: Normocephalic, without obvious abnormality, atraumatic Neck: no adenopathy, supple, symmetrical, trachea midline and thyroid normal to inspection and palpation Lungs: clear to auscultation bilaterally Breasts: bilateral implants, normal appearance, no masses or tenderness, No nipple retraction or dimpling, No nipple discharge or bleeding, No axillary or supraclavicular adenopathy Heart: regular rate and rhythm Abdomen: soft, non-tender; no masses, no organomegaly Extremities: extremities normal, atraumatic, no cyanosis or edema Skin: Skin color, texture, turgor normal. No rashes or lesions Lymph nodes: Cervical, supraclavicular, and axillary nodes normal. No abnormal inguinal nodes palpated Neurologic: Grossly normal  Pelvic: External genitalia:  no lesions              Urethra:  normal appearing urethra with no masses, tenderness or  lesions              Bartholins and Skenes: normal                 Vagina: normal appearing vagina with normal color and discharge, no lesions              Cervix:  Absent.              Pap taken: No. Bimanual Exam:  Uterus:   Absent..  Bimanual exam limited by involuntary guarding due to bladder sensation per patient.               Adnexa: no mass, fullness, tenderness              Rectal exam: Yes.  .  Confirms.              Anus:  normal sphincter tone, no lesions  Chaperone was present for exam.  Assessment:   Well woman visit with normal exam. Status post TAH.  Off ERT. Bilateral breast implants. Osteopenia.  History of low Vit D.   On prescription Rx.   Plan: Mammogram screening discussed.  She will schedule this at [facility] Recommended self breast awareness. Pap and HR HPV as above. Guidelines for Calcium, Vitamin D, regular exercise program including cardiovascular and weight bearing exercise. Check Vit D.  BMD ordered. Discussed hep C testing, which she will pursue with her PCP. She may choose to do her annual well woman visits with her PCP.  She will inquire about this with him. Follow up annually and prn.      After visit summary provided.

## 2016-11-05 ENCOUNTER — Ambulatory Visit (INDEPENDENT_AMBULATORY_CARE_PROVIDER_SITE_OTHER): Payer: Medicare Other | Admitting: Obstetrics and Gynecology

## 2016-11-05 ENCOUNTER — Encounter: Payer: Self-pay | Admitting: Obstetrics and Gynecology

## 2016-11-05 VITALS — BP 142/80 | HR 70 | Resp 20 | Ht 60.5 in | Wt 158.8 lb

## 2016-11-05 DIAGNOSIS — Z780 Asymptomatic menopausal state: Secondary | ICD-10-CM | POA: Diagnosis not present

## 2016-11-05 DIAGNOSIS — Z124 Encounter for screening for malignant neoplasm of cervix: Secondary | ICD-10-CM | POA: Diagnosis not present

## 2016-11-05 DIAGNOSIS — M8580 Other specified disorders of bone density and structure, unspecified site: Secondary | ICD-10-CM | POA: Diagnosis not present

## 2016-11-05 DIAGNOSIS — E559 Vitamin D deficiency, unspecified: Secondary | ICD-10-CM | POA: Diagnosis not present

## 2016-11-05 DIAGNOSIS — Z01419 Encounter for gynecological examination (general) (routine) without abnormal findings: Secondary | ICD-10-CM

## 2016-11-05 NOTE — Patient Instructions (Signed)

## 2016-11-06 ENCOUNTER — Other Ambulatory Visit: Payer: Self-pay | Admitting: Obstetrics and Gynecology

## 2016-11-06 DIAGNOSIS — Z1231 Encounter for screening mammogram for malignant neoplasm of breast: Secondary | ICD-10-CM

## 2016-11-06 LAB — VITAMIN D 25 HYDROXY (VIT D DEFICIENCY, FRACTURES): VIT D 25 HYDROXY: 36 ng/mL (ref 30–100)

## 2016-11-09 ENCOUNTER — Other Ambulatory Visit: Payer: Self-pay | Admitting: Obstetrics and Gynecology

## 2016-11-09 ENCOUNTER — Encounter: Payer: Self-pay | Admitting: Obstetrics and Gynecology

## 2016-11-09 DIAGNOSIS — E559 Vitamin D deficiency, unspecified: Secondary | ICD-10-CM

## 2016-11-22 DIAGNOSIS — N958 Other specified menopausal and perimenopausal disorders: Secondary | ICD-10-CM | POA: Diagnosis not present

## 2016-11-29 ENCOUNTER — Ambulatory Visit
Admission: RE | Admit: 2016-11-29 | Discharge: 2016-11-29 | Disposition: A | Discharge status: Home / Self Care | Payer: Medicare Other | Source: Ambulatory Visit | Attending: Obstetrics and Gynecology | Admitting: Obstetrics and Gynecology

## 2016-11-29 ENCOUNTER — Other Ambulatory Visit: Payer: Self-pay | Admitting: Obstetrics and Gynecology

## 2016-11-29 DIAGNOSIS — Z780 Asymptomatic menopausal state: Secondary | ICD-10-CM

## 2016-11-29 DIAGNOSIS — Z1231 Encounter for screening mammogram for malignant neoplasm of breast: Secondary | ICD-10-CM

## 2016-11-29 DIAGNOSIS — M85852 Other specified disorders of bone density and structure, left thigh: Secondary | ICD-10-CM | POA: Diagnosis not present

## 2016-12-01 ENCOUNTER — Encounter: Payer: Self-pay | Admitting: Obstetrics and Gynecology

## 2016-12-03 DIAGNOSIS — D485 Neoplasm of uncertain behavior of skin: Secondary | ICD-10-CM | POA: Diagnosis not present

## 2016-12-03 DIAGNOSIS — L918 Other hypertrophic disorders of the skin: Secondary | ICD-10-CM | POA: Diagnosis not present

## 2016-12-03 DIAGNOSIS — D225 Melanocytic nevi of trunk: Secondary | ICD-10-CM | POA: Diagnosis not present

## 2016-12-03 DIAGNOSIS — L821 Other seborrheic keratosis: Secondary | ICD-10-CM | POA: Diagnosis not present

## 2016-12-03 DIAGNOSIS — Z85828 Personal history of other malignant neoplasm of skin: Secondary | ICD-10-CM | POA: Diagnosis not present

## 2017-01-10 ENCOUNTER — Other Ambulatory Visit (HOSPITAL_COMMUNITY): Payer: Self-pay | Admitting: Internal Medicine

## 2017-01-10 DIAGNOSIS — M25562 Pain in left knee: Secondary | ICD-10-CM | POA: Diagnosis not present

## 2017-01-15 ENCOUNTER — Ambulatory Visit (HOSPITAL_COMMUNITY)
Admission: RE | Admit: 2017-01-15 | Discharge: 2017-01-15 | Disposition: A | Discharge status: Home / Self Care | Payer: Medicare Other | Source: Ambulatory Visit | Attending: Internal Medicine | Admitting: Internal Medicine

## 2017-01-15 DIAGNOSIS — M25562 Pain in left knee: Secondary | ICD-10-CM | POA: Diagnosis not present

## 2017-01-17 DIAGNOSIS — S83282A Other tear of lateral meniscus, current injury, left knee, initial encounter: Secondary | ICD-10-CM | POA: Diagnosis not present

## 2017-01-21 DIAGNOSIS — M25562 Pain in left knee: Secondary | ICD-10-CM | POA: Diagnosis not present

## 2017-01-24 ENCOUNTER — Encounter: Payer: Self-pay | Admitting: Gastroenterology

## 2017-01-28 DIAGNOSIS — S83282D Other tear of lateral meniscus, current injury, left knee, subsequent encounter: Secondary | ICD-10-CM | POA: Diagnosis not present

## 2017-02-06 DIAGNOSIS — H2513 Age-related nuclear cataract, bilateral: Secondary | ICD-10-CM | POA: Diagnosis not present

## 2017-02-06 DIAGNOSIS — H33103 Unspecified retinoschisis, bilateral: Secondary | ICD-10-CM | POA: Diagnosis not present

## 2017-02-20 DIAGNOSIS — Y999 Unspecified external cause status: Secondary | ICD-10-CM | POA: Diagnosis not present

## 2017-02-20 DIAGNOSIS — M94262 Chondromalacia, left knee: Secondary | ICD-10-CM | POA: Diagnosis not present

## 2017-02-20 DIAGNOSIS — S83282A Other tear of lateral meniscus, current injury, left knee, initial encounter: Secondary | ICD-10-CM | POA: Diagnosis not present

## 2017-02-20 DIAGNOSIS — G8918 Other acute postprocedural pain: Secondary | ICD-10-CM | POA: Diagnosis not present

## 2017-02-20 DIAGNOSIS — S83242A Other tear of medial meniscus, current injury, left knee, initial encounter: Secondary | ICD-10-CM | POA: Diagnosis not present

## 2017-02-20 HISTORY — PX: KNEE ARTHROSCOPY: SUR90

## 2017-02-28 DIAGNOSIS — S83242D Other tear of medial meniscus, current injury, left knee, subsequent encounter: Secondary | ICD-10-CM | POA: Diagnosis not present

## 2017-02-28 DIAGNOSIS — S83282D Other tear of lateral meniscus, current injury, left knee, subsequent encounter: Secondary | ICD-10-CM | POA: Diagnosis not present

## 2017-03-11 ENCOUNTER — Encounter: Payer: Self-pay | Admitting: Gastroenterology

## 2017-03-21 DIAGNOSIS — S83282D Other tear of lateral meniscus, current injury, left knee, subsequent encounter: Secondary | ICD-10-CM | POA: Diagnosis not present

## 2017-03-21 DIAGNOSIS — S83242D Other tear of medial meniscus, current injury, left knee, subsequent encounter: Secondary | ICD-10-CM | POA: Diagnosis not present

## 2017-04-12 ENCOUNTER — Ambulatory Visit (AMBULATORY_SURGERY_CENTER): Payer: Self-pay | Admitting: *Deleted

## 2017-04-12 VITALS — Ht 61.5 in | Wt 156.4 lb

## 2017-04-12 DIAGNOSIS — Z800 Family history of malignant neoplasm of digestive organs: Secondary | ICD-10-CM

## 2017-04-12 MED ORDER — NA SULFATE-K SULFATE-MG SULF 17.5-3.13-1.6 GM/177ML PO SOLN: 1.0000 | Freq: Once | ORAL | 0 refills | Status: AC

## 2017-04-12 NOTE — Progress Notes (Signed)
Denies allergies to eggs or soy products. Denies complications with sedation or anesthesia. Denies O2 use. Denies use of diet or weight loss medications.  Emmi instructions given for colonoscopy.  

## 2017-04-26 ENCOUNTER — Ambulatory Visit (AMBULATORY_SURGERY_CENTER): Payer: Medicare Other | Admitting: Gastroenterology

## 2017-04-26 ENCOUNTER — Encounter: Payer: Self-pay | Admitting: Gastroenterology

## 2017-04-26 VITALS — BP 140/84 | HR 55 | Temp 97.8°F | Resp 13 | Ht 60.5 in | Wt 158.0 lb

## 2017-04-26 DIAGNOSIS — K635 Polyp of colon: Secondary | ICD-10-CM

## 2017-04-26 DIAGNOSIS — Z1211 Encounter for screening for malignant neoplasm of colon: Secondary | ICD-10-CM | POA: Diagnosis not present

## 2017-04-26 DIAGNOSIS — Z800 Family history of malignant neoplasm of digestive organs: Secondary | ICD-10-CM

## 2017-04-26 DIAGNOSIS — D128 Benign neoplasm of rectum: Secondary | ICD-10-CM

## 2017-04-26 DIAGNOSIS — Z1212 Encounter for screening for malignant neoplasm of rectum: Secondary | ICD-10-CM

## 2017-04-26 DIAGNOSIS — D126 Benign neoplasm of colon, unspecified: Secondary | ICD-10-CM | POA: Diagnosis not present

## 2017-04-26 MED ORDER — SODIUM CHLORIDE 0.9 % IV SOLN: 500.0000 mL | INTRAVENOUS | Status: DC

## 2017-04-26 NOTE — Progress Notes (Signed)
Called to room to assist during endoscopic procedure.  Patient ID and intended procedure confirmed with present staff. Received instructions for my participation in the procedure from the performing physician.  

## 2017-04-26 NOTE — Op Note (Signed)
[facility] Patient Name: Kelly Lozano Procedure Date: 04/26/2017 11:44 AM MRN: [id number] Endoscopist: Mauri Pole , MD Age: 66 Referring MD:  Date of Birth: Nov 21, 1950 Gender: Female Account #: [id number] Procedure:                Colonoscopy Indications:              Screening in patient at increased risk: Family                            history of 1st-degree relative with colorectal                            cancer before age 57 years Medicines:                Monitored Anesthesia Care Procedure:                Pre-Anesthesia Assessment:                           - Prior to the procedure, a History and Physical                            was performed, and patient medications and                            allergies were reviewed. The patient's tolerance of                            previous anesthesia was also reviewed. The risks                            and benefits of the procedure and the sedation                            options and risks were discussed with the patient.                            All questions were answered, and informed consent                            was obtained. Prior Anticoagulants: The patient has                            taken no previous anticoagulant or antiplatelet                            agents. ASA Grade Assessment: II - A patient with                            mild systemic disease. After reviewing the risks                            and benefits, the patient was deemed in  satisfactory condition to undergo the procedure.                           After obtaining informed consent, the colonoscope                            was passed under direct vision. Throughout the                            procedure, the patient's blood pressure, pulse, and                            oxygen saturations were monitored continuously. The                            Model CF-HQ190L [id number]) scope  was introduced                            through the anus and advanced to the the cecum,                            identified by appendiceal orifice and ileocecal                            valve. The colonoscopy was performed without                            difficulty. The patient tolerated the procedure                            well. The quality of the bowel preparation was                            excellent. The ileocecal valve, appendiceal                            orifice, and rectum were photographed. Scope In: 11:54:33 AM Scope Out: 12:11:32 PM Scope Withdrawal Time: 0 hours 10 minutes 28 seconds  Total Procedure Duration: 0 hours 16 minutes 59 seconds  Findings:                 The perianal and digital rectal examinations were                            normal.                           A 1 mm polyp was found in the rectum. The polyp was                            sessile. The polyp was removed with a cold biopsy                            forceps. Resection and retrieval were complete.  Multiple small and large-mouthed diverticula were                            found in the sigmoid colon and descending colon.                           Non-bleeding internal hemorrhoids were found during                            retroflexion. The hemorrhoids were small.                           The exam was otherwise without abnormality. Complications:            No immediate complications. Estimated Blood Loss:     Estimated blood loss was minimal. Impression:               - One 1 mm polyp in the rectum, removed with a cold                            biopsy forceps. Resected and retrieved.                           - Diverticulosis in the sigmoid colon and in the                            descending colon.                           - Non-bleeding internal hemorrhoids.                           - The examination was otherwise normal. Recommendation:            - Patient has a contact number available for                            emergencies. The signs and symptoms of potential                            delayed complications were discussed with the                            patient. Return to normal activities tomorrow.                            Written discharge instructions were provided to the                            patient.                           - Resume previous diet.                           - Continue present medications.                           -  Await pathology results.                           - Repeat colonoscopy in 5 years for surveillance                            based on pathology results. Mauri Pole, MD 04/26/2017 12:20:58 PM This report has been signed electronically.

## 2017-04-26 NOTE — Progress Notes (Signed)
A/ox3 pleased with MAC, report to Sheila RN 

## 2017-04-26 NOTE — Patient Instructions (Signed)
YOU HAD AN ENDOSCOPIC PROCEDURE TODAY AT THE [facility]:   Refer to the procedure report that was given to you for any specific questions about what was found during the examination.  If the procedure report does not answer your questions, please call your gastroenterologist to clarify.  If you requested that your care partner not be given the details of your procedure findings, then the procedure report has been included in a sealed envelope for you to review at your convenience later.  YOU SHOULD EXPECT: Some feelings of bloating in the abdomen. Passage of more gas than usual.  Walking can help get rid of the air that was put into your GI tract during the procedure and reduce the bloating. If you had a lower endoscopy (such as a colonoscopy or flexible sigmoidoscopy) you may notice spotting of blood in your stool or on the toilet paper. If you underwent a bowel prep for your procedure, you may not have a normal bowel movement for a few days.  Please Note:  You might notice some irritation and congestion in your nose or some drainage.  This is from the oxygen used during your procedure.  There is no need for concern and it should clear up in a day or so.  SYMPTOMS TO REPORT IMMEDIATELY:   Following lower endoscopy (colonoscopy or flexible sigmoidoscopy):  Excessive amounts of blood in the stool  Significant tenderness or worsening of abdominal pains  Swelling of the abdomen that is new, acute  Fever of 100F or higher    For urgent or emergent issues, a gastroenterologist can be reached at any hour by calling [phone].   DIET:  We do recommend a small meal at first, but then you may proceed to your regular diet.  Drink plenty of fluids but you should avoid alcoholic beverages for 24 hours.  ACTIVITY:  You should plan to take it easy for the rest of today and you should NOT DRIVE or use heavy machinery until tomorrow (because of the sedation medicines used during the test).     FOLLOW UP: Our staff will call the number listed on your records the next business day following your procedure to check on you and address any questions or concerns that you may have regarding the information given to you following your procedure. If we do not reach you, we will leave a message.  However, if you are feeling well and you are not experiencing any problems, there is no need to return our call.  We will assume that you have returned to your regular daily activities without incident.  If any biopsies were taken you will be contacted by phone or by letter within the next 1-3 weeks.  Please call us at [phone] if you have not heard about the biopsies in 3 weeks.    SIGNATURES/CONFIDENTIALITY: You and/or your care partner have signed paperwork which will be entered into your electronic medical record.  These signatures attest to the fact that that the information above on your After Visit Summary has been reviewed and is understood.  Full responsibility of the confidentiality of this discharge information lies with you and/or your care-partner.   Resume medications. Information given on polyps,diverticulosis and high fiber diet. 

## 2017-04-26 NOTE — Progress Notes (Signed)
Pt's states no medical or surgical changes since previsit or office visit. 

## 2017-04-29 ENCOUNTER — Telehealth: Payer: Self-pay | Admitting: *Deleted

## 2017-04-29 NOTE — Telephone Encounter (Signed)
No answer, message left for the patient. 

## 2017-04-29 NOTE — Telephone Encounter (Signed)
  Follow up Call-  Call back number 04/26/2017  Post procedure Call Back phone  # [phone]  Permission to leave phone message Yes  Some recent data might be hidden     Patient questions:  Do you have a fever, pain , or abdominal swelling? No. Pain Score  0 *  Have you tolerated food without any problems? Yes.    Have you been able to return to your normal activities? Yes.    Do you have any questions about your discharge instructions: Diet   No. Medications  No. Follow up visit  No.  Do you have questions or concerns about your Care? No.  Actions: * If pain score is 4 or above: No action needed, pain <4.

## 2017-04-30 ENCOUNTER — Encounter: Payer: Self-pay | Admitting: Gastroenterology

## 2017-06-21 DIAGNOSIS — I10 Essential (primary) hypertension: Secondary | ICD-10-CM | POA: Diagnosis not present

## 2017-06-21 DIAGNOSIS — E559 Vitamin D deficiency, unspecified: Secondary | ICD-10-CM | POA: Diagnosis not present

## 2017-06-21 DIAGNOSIS — E785 Hyperlipidemia, unspecified: Secondary | ICD-10-CM | POA: Diagnosis not present

## 2017-06-21 DIAGNOSIS — Z79899 Other long term (current) drug therapy: Secondary | ICD-10-CM | POA: Diagnosis not present

## 2017-06-28 DIAGNOSIS — Z6828 Body mass index (BMI) 28.0-28.9, adult: Secondary | ICD-10-CM | POA: Diagnosis not present

## 2017-06-28 DIAGNOSIS — Z23 Encounter for immunization: Secondary | ICD-10-CM | POA: Diagnosis not present

## 2017-06-28 DIAGNOSIS — N951 Menopausal and female climacteric states: Secondary | ICD-10-CM | POA: Diagnosis not present

## 2017-06-28 DIAGNOSIS — I10 Essential (primary) hypertension: Secondary | ICD-10-CM | POA: Diagnosis not present

## 2017-06-28 DIAGNOSIS — E785 Hyperlipidemia, unspecified: Secondary | ICD-10-CM | POA: Diagnosis not present

## 2017-08-05 DIAGNOSIS — Z23 Encounter for immunization: Secondary | ICD-10-CM | POA: Diagnosis not present

## 2017-11-22 ENCOUNTER — Ambulatory Visit: Payer: Medicare Other | Admitting: Obstetrics and Gynecology

## 2017-11-27 ENCOUNTER — Other Ambulatory Visit: Payer: Self-pay

## 2017-11-27 ENCOUNTER — Encounter: Payer: Self-pay | Admitting: Obstetrics and Gynecology

## 2017-11-27 ENCOUNTER — Ambulatory Visit (INDEPENDENT_AMBULATORY_CARE_PROVIDER_SITE_OTHER): Payer: Medicare Other | Admitting: Obstetrics and Gynecology

## 2017-11-27 VITALS — BP 140/88 | HR 84 | Resp 14 | Ht 61.0 in | Wt 156.0 lb

## 2017-11-27 DIAGNOSIS — Z124 Encounter for screening for malignant neoplasm of cervix: Secondary | ICD-10-CM

## 2017-11-27 DIAGNOSIS — Z01419 Encounter for gynecological examination (general) (routine) without abnormal findings: Secondary | ICD-10-CM | POA: Diagnosis not present

## 2017-11-27 NOTE — Patient Instructions (Signed)

## 2017-11-27 NOTE — Progress Notes (Signed)
67 y.o. G2P2 Married Caucasian female here for annual exam.    Back on HRT through PCP.  Went off her estrogen and was waking up at night gasping for breath.  She saw her PCP and he restarted Estrace, and her symptoms resolved. Transdermal patches do not stick.   Had arthroscopic knee surgery.   Labs through PCP.   PCP:  Asencion Noble   No LMP recorded. Patient has had a hysterectomy.           Sexually active: No.  The current method of family planning is status post hysterectomy. Exercising: Yes.    walking Smoker:  no  Health Maintenance: Pap:  2003 Normal History of abnormal Pap:  no MMG:  11/29/16 BIRADS1:neg  Colonoscopy: 02/2012 f/u 5 years due to family hx.  Colonoscopy with Dr. Burna Mortimer at [place].  Polyp removed.  Due again in 5 years.  BMD:   11/29/16  Result:  Osteopenia TDaP: 2013 HIV: donates blood Hep C: donates blood Screening Labs: PCP   reports that  has never smoked. she has never used smokeless tobacco. She reports that she does not drink alcohol or use drugs.  Past Medical History:  Diagnosis Date  . Arthritis   . Cystitis, interstitial   . Depression   . Elevated cholesterol   . GERD (gastroesophageal reflux disease)   . Hemangioma of liver   . Hypertension   . Kidney stones   . Osteopenia 2018   hip  . Stricture esophagus     Past Surgical History:  Procedure Laterality Date  . APPENDECTOMY    . AUGMENTATION MAMMAPLASTY    . basil cell excised left arm  2012 Left   . CHOLECYSTECTOMY  2010   during liver sx  . COLONOSCOPY    . EYE SURGERY  10/2012   fatty tissue removed and graft from eyeball  . KNEE ARTHROSCOPY Left 02/20/2017  . KNEE ARTHROSCOPY Right 1980's  . LIVER RESECTION  2010   1/2 liver removed at [facility] for hemangioma  . TOTAL ABDOMINAL HYSTERECTOMY    . TUBAL LIGATION    . UPPER GASTROINTESTINAL ENDOSCOPY      Current Outpatient Medications  Medication Sig Dispense Refill  . Cholecalciferol (VITAMIN D3) 2000 units capsule  Take 2,000 Units by mouth daily.    Marland Kitchen estradiol (ESTRACE) 0.5 MG tablet Take 0.5 mg by mouth daily.    . hydrochlorothiazide (HYDRODIURIL) 25 MG tablet Take 1 tablet by mouth Daily.    Marland Kitchen omeprazole (PRILOSEC) 20 MG capsule TAKE 1 CAPSULE (20 MG TOTAL) BY MOUTH DAILY. 30 capsule 11  . ZETIA 10 MG tablet Take 1 tablet by mouth Daily.     Current Facility-Administered Medications  Medication Dose Route Frequency Provider Last Rate Last Dose  . 0.9 %  sodium chloride infusion  500 mL Intravenous Continuous Nandigam, Venia Minks, MD        Family History  Problem Relation Age of Onset  . Colon cancer Father 90  . Diabetes Mother   . Hypertension Mother   . Esophageal cancer Neg Hx   . Rectal cancer Neg Hx   . Stomach cancer Neg Hx     ROS:  Pertinent items are noted in HPI.  Otherwise, a comprehensive ROS was negative.  Exam:   BP 140/88 (BP Location: Right Arm, Patient Position: Sitting, Cuff Size: Normal)   Pulse 84   Resp 14   Ht 5\' 1"  (1.549 m)   Wt 156 lb (70.8 kg)  BMI 29.48 kg/m     General appearance: alert, cooperative and appears stated age Head: Normocephalic, without obvious abnormality, atraumatic Neck: no adenopathy, supple, symmetrical, trachea midline and thyroid normal to inspection and palpation Lungs: clear to auscultation bilaterally Breasts: bilateral implants, normal appearance, no masses or tenderness, No nipple retraction or dimpling, No nipple discharge or bleeding, No axillary or supraclavicular adenopathy Heart: regular rate and rhythm Abdomen: soft, non-tender; no masses, no organomegaly Extremities: extremities normal, atraumatic, no cyanosis or edema Skin: Skin color, texture, turgor normal. No rashes or lesions Lymph nodes: Cervical, supraclavicular, and axillary nodes normal. No abnormal inguinal nodes palpated Neurologic: Grossly normal  Pelvic: External genitalia:  no lesions              Urethra:  normal appearing urethra with no masses,  tenderness or lesions              Bartholins and Skenes: normal                 Vagina: normal appearing vagina with normal color and discharge, no lesions              Cervix:  Absent.               Pap taken: No. Bimanual Exam:  Uterus:  absent              Adnexa: no mass, fullness, tenderness              Rectal exam: Yes.  .  Confirms.              Anus:  normal sphincter tone, no lesions  Chaperone was present for exam.  Assessment:   Well woman visit with normal exam. Status post TAH/BSO.   On ERT through PCP.  Bilateral breast implants. Osteopenia.  History of low Vit D.  On over the counter tx.   Plan: Mammogram screening discussed.  She will schedule.  Recommended self breast awareness. Pap and HR HPV as above. Guidelines for Calcium, Vitamin D, regular exercise program including cardiovascular and weight bearing exercise. We discussed risks of ERT - PE, DVT, stroke, and possible breast cancer.  We reviewed the benefits of transdermal estrogen which she declines.  Rx of estrace through PCP.  BMD next year. Follow up annually and prn.   After visit summary provided.

## 2017-12-03 ENCOUNTER — Other Ambulatory Visit: Payer: Self-pay | Admitting: Obstetrics and Gynecology

## 2017-12-03 DIAGNOSIS — Z1231 Encounter for screening mammogram for malignant neoplasm of breast: Secondary | ICD-10-CM

## 2017-12-19 ENCOUNTER — Ambulatory Visit
Admission: RE | Admit: 2017-12-19 | Discharge: 2017-12-19 | Disposition: A | Discharge status: Home / Self Care | Payer: Medicare Other | Source: Ambulatory Visit | Attending: Obstetrics and Gynecology | Admitting: Obstetrics and Gynecology

## 2017-12-19 DIAGNOSIS — Z1231 Encounter for screening mammogram for malignant neoplasm of breast: Secondary | ICD-10-CM | POA: Diagnosis not present

## 2018-01-27 DIAGNOSIS — L700 Acne vulgaris: Secondary | ICD-10-CM | POA: Diagnosis not present

## 2018-01-27 DIAGNOSIS — L821 Other seborrheic keratosis: Secondary | ICD-10-CM | POA: Diagnosis not present

## 2018-01-27 DIAGNOSIS — L918 Other hypertrophic disorders of the skin: Secondary | ICD-10-CM | POA: Diagnosis not present

## 2018-01-27 DIAGNOSIS — L820 Inflamed seborrheic keratosis: Secondary | ICD-10-CM | POA: Diagnosis not present

## 2018-01-27 DIAGNOSIS — Z85828 Personal history of other malignant neoplasm of skin: Secondary | ICD-10-CM | POA: Diagnosis not present

## 2018-01-27 DIAGNOSIS — L570 Actinic keratosis: Secondary | ICD-10-CM | POA: Diagnosis not present

## 2018-01-27 DIAGNOSIS — C44519 Basal cell carcinoma of skin of other part of trunk: Secondary | ICD-10-CM | POA: Diagnosis not present

## 2018-01-27 DIAGNOSIS — D1801 Hemangioma of skin and subcutaneous tissue: Secondary | ICD-10-CM | POA: Diagnosis not present

## 2018-02-09 IMAGING — US US EXTREM LOW*L* LIMITED
1 series · 5 of 5 positions shown · non-contrast
Comparison: No recent .

CLINICAL DATA: Left knee pain.

EXAM:
ULTRASOUND LEFT LOWER EXTREMITY LIMITED
TECHNIQUE: Ultrasound examination of the lower extremity soft tissues was
performed in the area of clinical concern.

[Series 1: us extrem low*left* limited · 0.11mm/px · 5 of 5 slices shown]
[im 1/5]
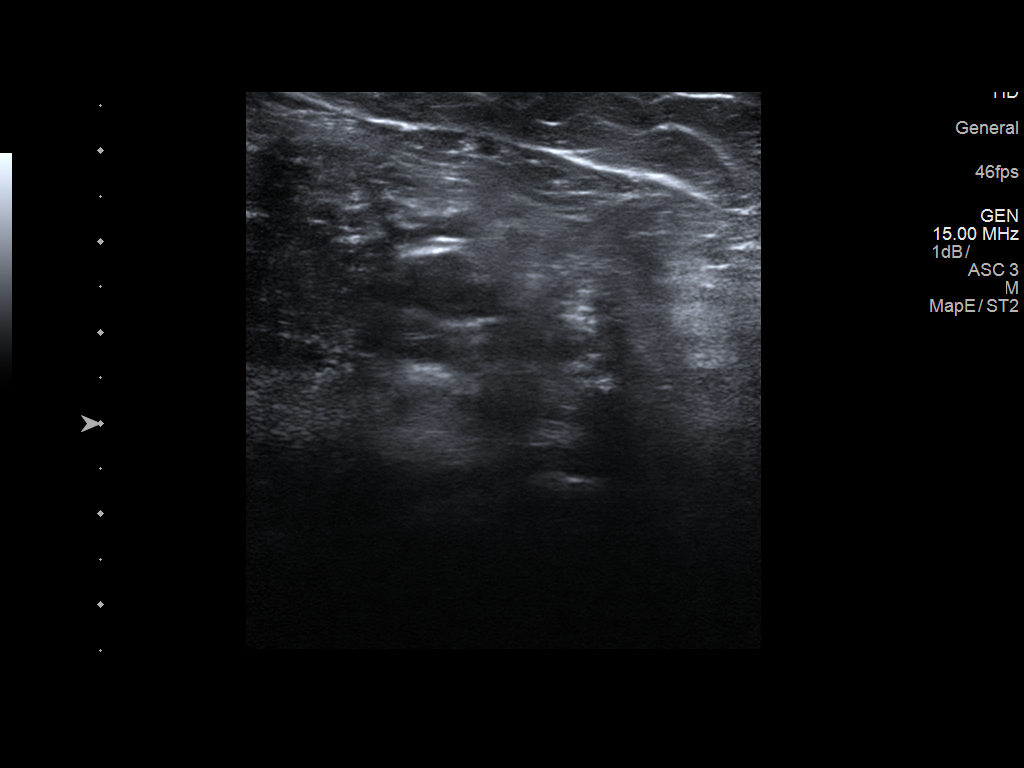
[im 2/5]
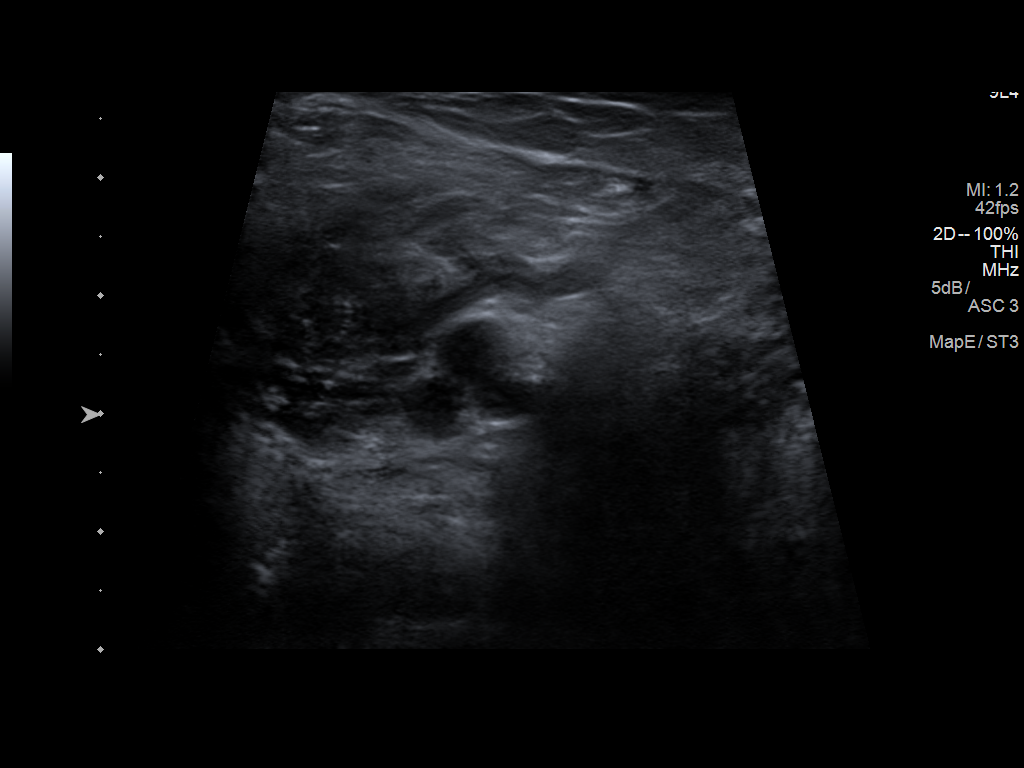
[im 3/5]
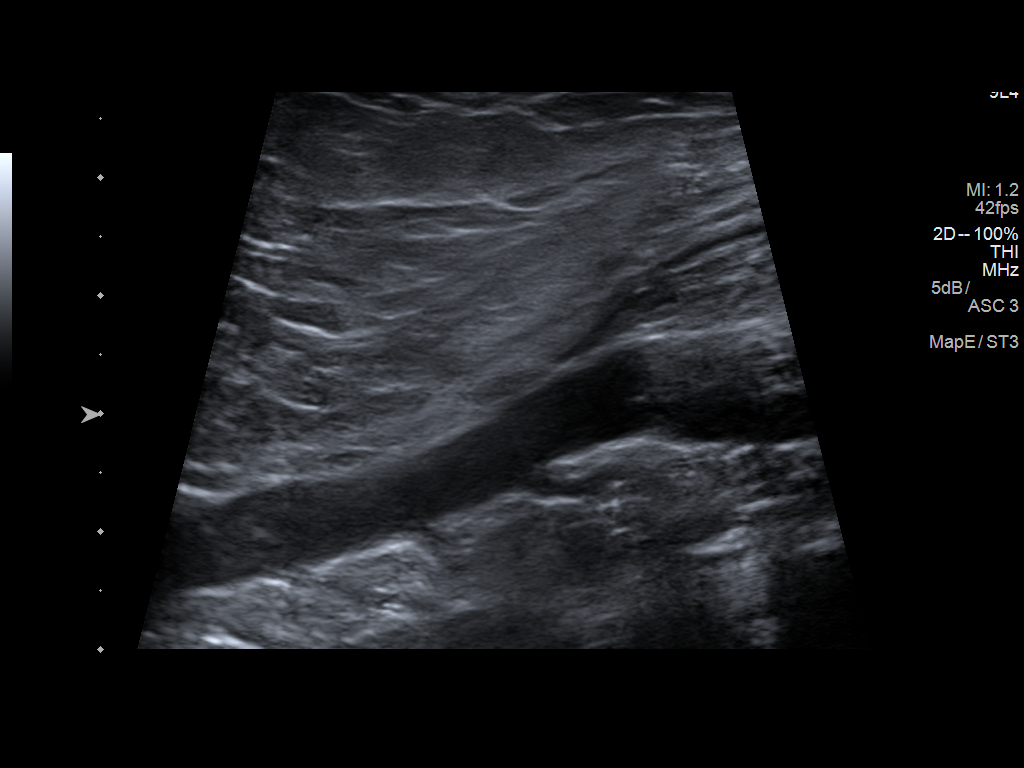
[im 4/5]
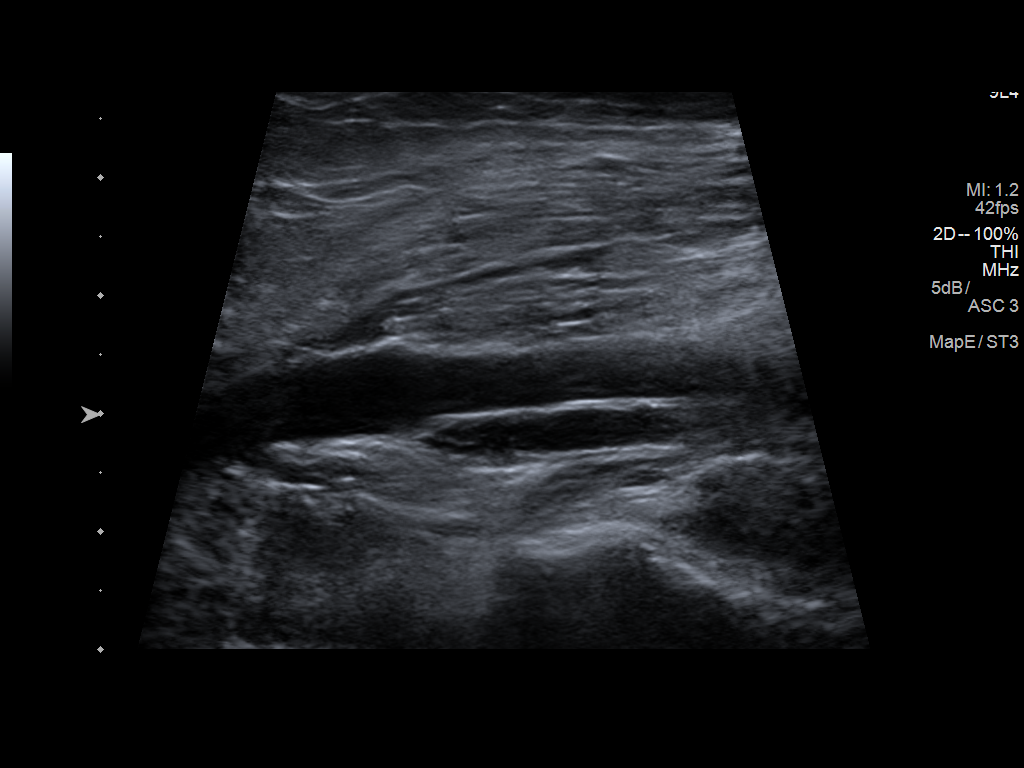
[im 5/5]
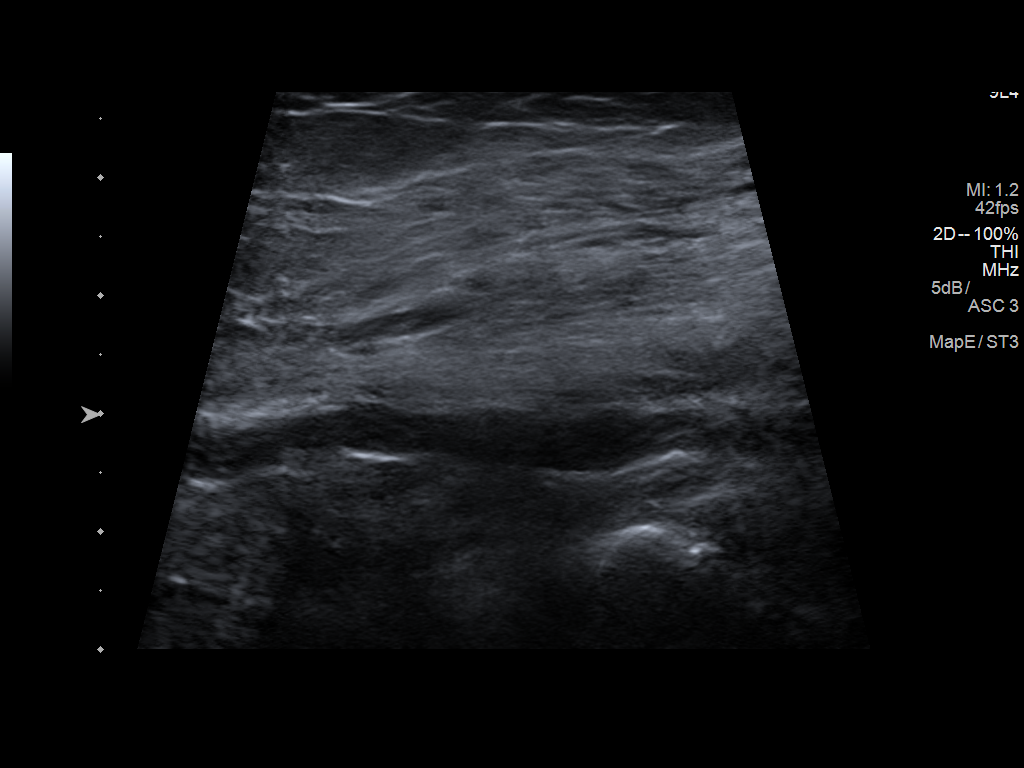

[5 of 5 positions shown; findings below may reference images not displayed]

FINDINGS: No cystic or solid abnormality.
IMPRESSION: No cystic or solid abnormality identified.

## 2018-04-01 DIAGNOSIS — H2513 Age-related nuclear cataract, bilateral: Secondary | ICD-10-CM | POA: Diagnosis not present

## 2018-04-01 DIAGNOSIS — H33103 Unspecified retinoschisis, bilateral: Secondary | ICD-10-CM | POA: Diagnosis not present

## 2018-04-30 DIAGNOSIS — L910 Hypertrophic scar: Secondary | ICD-10-CM | POA: Diagnosis not present

## 2018-04-30 DIAGNOSIS — Z85828 Personal history of other malignant neoplasm of skin: Secondary | ICD-10-CM | POA: Diagnosis not present

## 2018-08-21 DIAGNOSIS — Z23 Encounter for immunization: Secondary | ICD-10-CM | POA: Diagnosis not present

## 2018-09-22 DIAGNOSIS — E785 Hyperlipidemia, unspecified: Secondary | ICD-10-CM | POA: Diagnosis not present

## 2018-09-22 DIAGNOSIS — E559 Vitamin D deficiency, unspecified: Secondary | ICD-10-CM | POA: Diagnosis not present

## 2018-09-22 DIAGNOSIS — Z79899 Other long term (current) drug therapy: Secondary | ICD-10-CM | POA: Diagnosis not present

## 2018-09-22 DIAGNOSIS — I10 Essential (primary) hypertension: Secondary | ICD-10-CM | POA: Diagnosis not present

## 2018-09-26 DIAGNOSIS — E785 Hyperlipidemia, unspecified: Secondary | ICD-10-CM | POA: Diagnosis not present

## 2018-09-26 DIAGNOSIS — Z6829 Body mass index (BMI) 29.0-29.9, adult: Secondary | ICD-10-CM | POA: Diagnosis not present

## 2018-09-26 DIAGNOSIS — E559 Vitamin D deficiency, unspecified: Secondary | ICD-10-CM | POA: Diagnosis not present

## 2018-09-26 DIAGNOSIS — I10 Essential (primary) hypertension: Secondary | ICD-10-CM | POA: Diagnosis not present

## 2018-09-26 DIAGNOSIS — Z23 Encounter for immunization: Secondary | ICD-10-CM | POA: Diagnosis not present

## 2018-09-26 DIAGNOSIS — R001 Bradycardia, unspecified: Secondary | ICD-10-CM | POA: Diagnosis not present

## 2018-11-27 NOTE — Progress Notes (Deleted)
68 y.o. G2P2 Married Caucasian female here for annual exam.    PCP:     No LMP recorded. Patient has had a hysterectomy.           Sexually active: {yes no:314532}  The current method of family planning is status post hysterectomy.    Exercising: {yes no:314532}  {types:19826} Smoker:  no  Health Maintenance: Pap: 20032 normal History of abnormal Pap:  no MMG: 12-19-17 Implants/Neg/density B/BiRads1 Colonoscopy: 04-26-17 polyp;next due 5 years BMD:   ***?11-29-16 Result :Osteopenia TDaP:  2013 Gardasil:   no HIV:*** Hep C:*** Screening Labs:  Hb today: ***, Urine today: ***   reports that she has never smoked. She has never used smokeless tobacco. She reports that she does not drink alcohol or use drugs.  Past Medical History:  Diagnosis Date  . Arthritis   . Cystitis, interstitial   . Depression   . Elevated cholesterol   . GERD (gastroesophageal reflux disease)   . Hemangioma of liver   . Hypertension   . Kidney stones   . Osteopenia 2018   hip  . Stricture esophagus     Past Surgical History:  Procedure Laterality Date  . APPENDECTOMY    . AUGMENTATION MAMMAPLASTY    . basil cell excised left arm  2012 Left   . CHOLECYSTECTOMY  2010   during liver sx  . COLONOSCOPY    . EYE SURGERY  10/2012   fatty tissue removed and graft from eyeball  . KNEE ARTHROSCOPY Left 02/20/2017  . KNEE ARTHROSCOPY Right 1980's  . LIVER RESECTION  2010   1/2 liver removed at [facility] for hemangioma  . TOTAL ABDOMINAL HYSTERECTOMY    . TUBAL LIGATION    . UPPER GASTROINTESTINAL ENDOSCOPY      Current Outpatient Medications  Medication Sig Dispense Refill  . Cholecalciferol (VITAMIN D3) 2000 units capsule Take 2,000 Units by mouth daily.    Marland Kitchen estradiol (ESTRACE) 0.5 MG tablet Take 0.5 mg by mouth daily.    . hydrochlorothiazide (HYDRODIURIL) 25 MG tablet Take 1 tablet by mouth Daily.    Marland Kitchen omeprazole (PRILOSEC) 20 MG capsule TAKE 1 CAPSULE (20 MG TOTAL) BY MOUTH DAILY. 30 capsule 11   . ZETIA 10 MG tablet Take 1 tablet by mouth Daily.     Current Facility-Administered Medications  Medication Dose Route Frequency Provider Last Rate Last Dose  . 0.9 %  sodium chloride infusion  500 mL Intravenous Continuous Nandigam, Venia Minks, MD        Family History  Problem Relation Age of Onset  . Colon cancer Father 10  . Diabetes Mother   . Hypertension Mother   . Esophageal cancer Neg Hx   . Rectal cancer Neg Hx   . Stomach cancer Neg Hx     Review of Systems  Exam:   There were no vitals taken for this visit.    General appearance: alert, cooperative and appears stated age Head: Normocephalic, without obvious abnormality, atraumatic Neck: no adenopathy, supple, symmetrical, trachea midline and thyroid normal to inspection and palpation Lungs: clear to auscultation bilaterally Breasts: normal appearance, no masses or tenderness, No nipple retraction or dimpling, No nipple discharge or bleeding, No axillary or supraclavicular adenopathy Heart: regular rate and rhythm Abdomen: soft, non-tender; no masses, no organomegaly Extremities: extremities normal, atraumatic, no cyanosis or edema Skin: Skin color, texture, turgor normal. No rashes or lesions Lymph nodes: Cervical, supraclavicular, and axillary nodes normal. No abnormal inguinal nodes palpated Neurologic: Grossly normal  Pelvic: External genitalia:  no lesions              Urethra:  normal appearing urethra with no masses, tenderness or lesions              Bartholins and Skenes: normal                 Vagina: normal appearing vagina with normal color and discharge, no lesions              Cervix: no lesions              Pap taken: {yes no:314532} Bimanual Exam:  Uterus:  normal size, contour, position, consistency, mobility, non-tender              Adnexa: no mass, fullness, tenderness              Rectal exam: {yes no:314532}.  Confirms.              Anus:  normal sphincter tone, no lesions  Chaperone was  present for exam.  Assessment:   Well woman visit with normal exam.   Plan: Mammogram screening. Recommended self breast awareness. Pap and HR HPV as above. Guidelines for Calcium, Vitamin D, regular exercise program including cardiovascular and weight bearing exercise.   Follow up annually and prn.   Additional counseling given.  {yes [id number]. _______ minutes face to face time of which over 50% was spent in counseling.    After visit summary provided.

## 2018-11-28 ENCOUNTER — Ambulatory Visit: Payer: Medicare Other | Admitting: Obstetrics and Gynecology

## 2018-11-28 ENCOUNTER — Telehealth: Payer: Self-pay | Admitting: Obstetrics and Gynecology

## 2018-11-28 ENCOUNTER — Encounter: Payer: Self-pay | Admitting: Obstetrics and Gynecology

## 2018-11-28 NOTE — Telephone Encounter (Signed)
Thank you for the update!

## 2018-11-28 NOTE — Telephone Encounter (Signed)
Patient dnka her aex appointment today. [facility] policy followed.

## 2018-12-05 NOTE — Progress Notes (Signed)
68 y.o. G2P2 Married Caucasian female here for annual exam.    Taking estrogen through her PCP.  She was waking up choking and had a full evaluation with her PCP, which was negative.  She is weaning off and taking 0.5 mg and taking 1/2 of this.   States she always has diarrhea in the am.  Takes Prilosec which gives her diarrhea.  She has esophageal strictures if she does not take the Prilosec and then chokes.   Going to the beach for 2 weeks and will paint.  PCP: Oletha Blend, MD   No LMP recorded. Patient has had a hysterectomy.           Sexually active: No.  The current method of family planning is status post hysterectomy.    Exercising: Yes.    walking Smoker:  no  Health Maintenance: Pap: 2003 normal History of abnormal Pap:  no MMG: 12-19-17 3D/Implants/Neg/density B/Birads1.  She will schedule. Colonoscopy: 04-26-17 polyp;next due 03/2022 BMD: 11-29-16  Result :Osteopenia of hip.  TDaP:  2013 Gardasil:   no WIO:XBDZHGD blood Hep C:donates blood Screening Labs:  Hb today: PCP   reports that she has never smoked. She has never used smokeless tobacco. She reports that she does not drink alcohol or use drugs.  Past Medical History:  Diagnosis Date  . Arthritis   . Cystitis, interstitial   . Depression   . Elevated cholesterol   . GERD (gastroesophageal reflux disease)   . Hemangioma of liver   . Hypertension   . Kidney stones   . Osteopenia 2018   hip  . Stricture esophagus     Past Surgical History:  Procedure Laterality Date  . APPENDECTOMY    . AUGMENTATION MAMMAPLASTY    . basil cell excised left arm  2012 Left   . CHOLECYSTECTOMY  2010   during liver sx  . COLONOSCOPY    . EYE SURGERY  10/2012   fatty tissue removed and graft from eyeball  . KNEE ARTHROSCOPY Left 02/20/2017  . KNEE ARTHROSCOPY Right 1980's  . LIVER RESECTION  2010   1/2 liver removed at [facility] for hemangioma  . TOTAL ABDOMINAL HYSTERECTOMY    . TUBAL LIGATION    . UPPER  GASTROINTESTINAL ENDOSCOPY      Current Outpatient Medications  Medication Sig Dispense Refill  . ALPRAZolam (XANAX) 0.25 MG tablet Take 1 tablet by mouth as needed.    . Cholecalciferol (VITAMIN D3) 2000 units capsule Take 2,000 Units by mouth daily.    Marland Kitchen estradiol (ESTRACE) 0.5 MG tablet Take 0.5 mg by mouth. Takes 1/2 tablet daily    . hydrochlorothiazide (HYDRODIURIL) 25 MG tablet Take 1 tablet by mouth Daily.    Marland Kitchen omeprazole (PRILOSEC) 20 MG capsule TAKE 1 CAPSULE (20 MG TOTAL) BY MOUTH DAILY. 30 capsule 11  . rosuvastatin (CRESTOR) 10 MG tablet Take 1 tablet by mouth. Takes 1 weekly    . ZETIA 10 MG tablet Take 1 tablet by mouth Daily.     Current Facility-Administered Medications  Medication Dose Route Frequency Provider Last Rate Last Dose  . 0.9 %  sodium chloride infusion  500 mL Intravenous Continuous Nandigam, Venia Minks, MD        Family History  Problem Relation Age of Onset  . Colon cancer Father 11  . Diabetes Mother   . Hypertension Mother   . Esophageal cancer Neg Hx   . Rectal cancer Neg Hx   . Stomach cancer Neg Hx  Review of Systems  Gastrointestinal: Positive for diarrhea (sees Dr.Fagan for this).  All other systems reviewed and are negative.   Exam:   BP 122/72 (BP Location: Right Arm, Patient Position: Sitting, Cuff Size: Normal)   Pulse 69   Resp 14   Ht 5\' 1"  (1.549 m)   Wt 157 lb 6.4 oz (71.4 kg)   BMI 29.74 kg/m     General appearance: alert, cooperative and appears stated age Head: Normocephalic, without obvious abnormality, atraumatic Neck: no adenopathy, supple, symmetrical, trachea midline and thyroid normal to inspection and palpation Lungs: clear to auscultation bilaterally Breasts: normal appearance, bilateral implants, no masses or tenderness, No nipple retraction or dimpling, No nipple discharge or bleeding, No axillary or supraclavicular adenopathy Heart: regular rate and rhythm Abdomen: soft, non-tender; no masses, no  organomegaly Extremities: extremities normal, atraumatic, no cyanosis or edema Skin: Skin color, texture, turgor normal. No rashes or lesions Lymph nodes: Cervical, supraclavicular, and axillary nodes normal. No abnormal inguinal nodes palpated Neurologic: Grossly normal  Pelvic: External genitalia:  no lesions              Urethra:  normal appearing urethra with no masses, tenderness or lesions              Bartholins and Skenes: normal                 Vagina: normal appearing vagina with normal color and discharge, no lesions              Cervix: absent.              Pap taken: No. Bimanual Exam:  Uterus:  Absent.              Adnexa: no mass, fullness, tenderness              Rectal exam: Yes.  .  Confirms.              Anus:  normal sphincter tone, no lesions  Chaperone was present for exam.  Assessment:   Well woman visit with normal exam. Status post TAH/BSO. On ERT through PCP.  Bilateral breast implants. Osteopenia.  History of low Vit D.On over the counter. Hyperlipidemia.   Plan: Mammogram screening. Recommended self breast awareness. Pap and HR HPV as above. Guidelines for Calcium, Vitamin D, regular exercise program including cardiovascular and weight bearing exercise. BMD ordered. She will try to do the same day as her mammogram.  Labs through PCP.  We reviewed risks of ERT including stroke, DVT, and PE.  I recommended she continue to wean.  Her PCP is prescribing.  Follow up annually and prn.   After visit summary provided.

## 2018-12-08 ENCOUNTER — Other Ambulatory Visit: Payer: Self-pay

## 2018-12-08 ENCOUNTER — Ambulatory Visit (INDEPENDENT_AMBULATORY_CARE_PROVIDER_SITE_OTHER): Payer: Medicare Other | Admitting: Obstetrics and Gynecology

## 2018-12-08 ENCOUNTER — Ambulatory Visit: Payer: Medicare Other | Admitting: Obstetrics and Gynecology

## 2018-12-08 ENCOUNTER — Encounter: Payer: Self-pay | Admitting: Obstetrics and Gynecology

## 2018-12-08 VITALS — BP 122/72 | HR 69 | Resp 14 | Ht 61.0 in | Wt 157.4 lb

## 2018-12-08 DIAGNOSIS — M8580 Other specified disorders of bone density and structure, unspecified site: Secondary | ICD-10-CM | POA: Diagnosis not present

## 2018-12-08 DIAGNOSIS — Z01419 Encounter for gynecological examination (general) (routine) without abnormal findings: Secondary | ICD-10-CM | POA: Diagnosis not present

## 2018-12-08 DIAGNOSIS — Z780 Asymptomatic menopausal state: Secondary | ICD-10-CM | POA: Diagnosis not present

## 2018-12-08 DIAGNOSIS — Z124 Encounter for screening for malignant neoplasm of cervix: Secondary | ICD-10-CM | POA: Diagnosis not present

## 2018-12-08 NOTE — Patient Instructions (Signed)

## 2018-12-19 ENCOUNTER — Other Ambulatory Visit: Payer: Self-pay | Admitting: Obstetrics and Gynecology

## 2018-12-19 DIAGNOSIS — E2839 Other primary ovarian failure: Secondary | ICD-10-CM

## 2018-12-19 DIAGNOSIS — Z1231 Encounter for screening mammogram for malignant neoplasm of breast: Secondary | ICD-10-CM

## 2018-12-22 DIAGNOSIS — E785 Hyperlipidemia, unspecified: Secondary | ICD-10-CM | POA: Diagnosis not present

## 2018-12-22 DIAGNOSIS — Z79899 Other long term (current) drug therapy: Secondary | ICD-10-CM | POA: Diagnosis not present

## 2018-12-29 DIAGNOSIS — Z6830 Body mass index (BMI) 30.0-30.9, adult: Secondary | ICD-10-CM | POA: Diagnosis not present

## 2018-12-29 DIAGNOSIS — E785 Hyperlipidemia, unspecified: Secondary | ICD-10-CM | POA: Diagnosis not present

## 2018-12-29 DIAGNOSIS — I10 Essential (primary) hypertension: Secondary | ICD-10-CM | POA: Diagnosis not present

## 2019-02-04 ENCOUNTER — Other Ambulatory Visit: Payer: Medicare Other

## 2019-02-04 ENCOUNTER — Ambulatory Visit: Payer: Medicare Other

## 2019-04-22 DIAGNOSIS — E785 Hyperlipidemia, unspecified: Secondary | ICD-10-CM | POA: Diagnosis not present

## 2019-04-29 DIAGNOSIS — E785 Hyperlipidemia, unspecified: Secondary | ICD-10-CM | POA: Diagnosis not present

## 2019-04-29 DIAGNOSIS — I10 Essential (primary) hypertension: Secondary | ICD-10-CM | POA: Diagnosis not present

## 2019-07-15 DIAGNOSIS — Z23 Encounter for immunization: Secondary | ICD-10-CM | POA: Diagnosis not present

## 2019-10-02 DIAGNOSIS — I10 Essential (primary) hypertension: Secondary | ICD-10-CM | POA: Diagnosis not present

## 2019-10-02 DIAGNOSIS — F419 Anxiety disorder, unspecified: Secondary | ICD-10-CM | POA: Diagnosis not present

## 2019-10-02 DIAGNOSIS — E559 Vitamin D deficiency, unspecified: Secondary | ICD-10-CM | POA: Diagnosis not present

## 2019-10-02 DIAGNOSIS — E785 Hyperlipidemia, unspecified: Secondary | ICD-10-CM | POA: Diagnosis not present

## 2019-10-02 DIAGNOSIS — Z79899 Other long term (current) drug therapy: Secondary | ICD-10-CM | POA: Diagnosis not present

## 2019-10-09 DIAGNOSIS — U071 COVID-19: Secondary | ICD-10-CM | POA: Diagnosis not present

## 2019-10-09 DIAGNOSIS — E785 Hyperlipidemia, unspecified: Secondary | ICD-10-CM | POA: Diagnosis not present

## 2019-10-09 DIAGNOSIS — I10 Essential (primary) hypertension: Secondary | ICD-10-CM | POA: Diagnosis not present

## 2019-12-11 ENCOUNTER — Ambulatory Visit: Payer: Medicare Other | Admitting: Obstetrics and Gynecology

## 2020-01-29 ENCOUNTER — Other Ambulatory Visit: Payer: Self-pay

## 2020-01-29 ENCOUNTER — Other Ambulatory Visit: Payer: Self-pay | Admitting: Obstetrics and Gynecology

## 2020-01-29 ENCOUNTER — Ambulatory Visit
Admission: RE | Admit: 2020-01-29 | Discharge: 2020-01-29 | Disposition: A | Discharge status: Home / Self Care | Payer: Medicare Other | Source: Ambulatory Visit | Attending: Obstetrics and Gynecology | Admitting: Obstetrics and Gynecology

## 2020-01-29 DIAGNOSIS — Z1231 Encounter for screening mammogram for malignant neoplasm of breast: Secondary | ICD-10-CM | POA: Diagnosis not present

## 2020-01-29 DIAGNOSIS — E2839 Other primary ovarian failure: Secondary | ICD-10-CM

## 2020-01-29 DIAGNOSIS — M8589 Other specified disorders of bone density and structure, multiple sites: Secondary | ICD-10-CM | POA: Diagnosis not present

## 2020-01-29 DIAGNOSIS — Z780 Asymptomatic menopausal state: Secondary | ICD-10-CM | POA: Diagnosis not present

## 2020-04-08 DIAGNOSIS — D693 Immune thrombocytopenic purpura: Secondary | ICD-10-CM | POA: Diagnosis not present

## 2020-04-08 DIAGNOSIS — I10 Essential (primary) hypertension: Secondary | ICD-10-CM | POA: Diagnosis not present

## 2020-05-04 ENCOUNTER — Other Ambulatory Visit: Payer: Self-pay

## 2020-05-04 ENCOUNTER — Ambulatory Visit (INDEPENDENT_AMBULATORY_CARE_PROVIDER_SITE_OTHER): Payer: Medicare Other | Admitting: Obstetrics & Gynecology

## 2020-05-04 ENCOUNTER — Encounter: Payer: Self-pay | Admitting: Obstetrics & Gynecology

## 2020-05-04 VITALS — BP 163/85 | HR 70 | Ht 61.0 in | Wt 153.7 lb

## 2020-05-04 DIAGNOSIS — Z0000 Encounter for general adult medical examination without abnormal findings: Secondary | ICD-10-CM | POA: Diagnosis not present

## 2020-05-04 DIAGNOSIS — Z01419 Encounter for gynecological examination (general) (routine) without abnormal findings: Secondary | ICD-10-CM

## 2020-05-04 DIAGNOSIS — Z79890 Hormone replacement therapy (postmenopausal): Secondary | ICD-10-CM

## 2020-05-04 MED ORDER — ESTRADIOL 0.5 MG PO TABS: 0.5000 mg | ORAL_TABLET | Freq: Every day | ORAL | 5 refills | Status: AC

## 2020-05-04 NOTE — Patient Instructions (Signed)
Preventive Care 38 Years and Older, Female Preventive care refers to lifestyle choices and visits with your health care provider that can promote health and wellness. This includes:  A yearly physical exam. This is also called an annual well check.  Regular dental and eye exams.  Immunizations.  Screening for certain conditions.  Healthy lifestyle choices, such as diet and exercise. What can I expect for my preventive care visit? Physical exam Your health care provider will check:  Height and weight. These may be used to calculate body mass index (BMI), which is a measurement that tells if you are at a healthy weight.  Heart rate and blood pressure.  Your skin for abnormal spots. Counseling Your health care provider may ask you questions about:  Alcohol, tobacco, and drug use.  Emotional well-being.  Home and relationship well-being.  Sexual activity.  Eating habits.  History of falls.  Memory and ability to understand (cognition).  Work and work Statistician.  Pregnancy and menstrual history. What immunizations do I need?  Influenza (flu) vaccine  This is recommended every year. Tetanus, diphtheria, and pertussis (Tdap) vaccine  You may need a Td booster every 10 years. Varicella (chickenpox) vaccine  You may need this vaccine if you have not already been vaccinated. Zoster (shingles) vaccine  You may need this after age 33. Pneumococcal conjugate (PCV13) vaccine  One dose is recommended after age 33. Pneumococcal polysaccharide (PPSV23) vaccine  One dose is recommended after age 72. Measles, mumps, and rubella (MMR) vaccine  You may need at least one dose of MMR if you were born in 1957 or later. You may also need a second dose. Meningococcal conjugate (MenACWY) vaccine  You may need this if you have certain conditions. Hepatitis A vaccine  You may need this if you have certain conditions or if you travel or work in places where you may be exposed  to hepatitis A. Hepatitis B vaccine  You may need this if you have certain conditions or if you travel or work in places where you may be exposed to hepatitis B. Haemophilus influenzae type b (Hib) vaccine  You may need this if you have certain conditions. You may receive vaccines as individual doses or as more than one vaccine together in one shot (combination vaccines). Talk with your health care provider about the risks and benefits of combination vaccines. What tests do I need? Blood tests  Lipid and cholesterol levels. These may be checked every 5 years, or more frequently depending on your overall health.  Hepatitis C test.  Hepatitis B test. Screening  Lung cancer screening. You may have this screening every year starting at age 39 if you have a 30-pack-year history of smoking and currently smoke or have quit within the past 15 years.  Colorectal cancer screening. All adults should have this screening starting at age 36 and continuing until age 15. Your health care provider may recommend screening at age 23 if you are at increased risk. You will have tests every 1-10 years, depending on your results and the type of screening test.  Diabetes screening. This is done by checking your blood sugar (glucose) after you have not eaten for a while (fasting). You may have this done every 1-3 years.  Mammogram. This may be done every 1-2 years. Talk with your health care provider about how often you should have regular mammograms.  BRCA-related cancer screening. This may be done if you have a family history of breast, ovarian, tubal, or peritoneal cancers.  Other tests  Sexually transmitted disease (STD) testing.  Bone density scan. This is done to screen for osteoporosis. You may have this done starting at age 44. Follow these instructions at home: Eating and drinking  Eat a diet that includes fresh fruits and vegetables, whole grains, lean protein, and low-fat dairy products. Limit  your intake of foods with high amounts of sugar, saturated fats, and salt.  Take vitamin and mineral supplements as recommended by your health care provider.  Do not drink alcohol if your health care provider tells you not to drink.  If you drink alcohol: ? Limit how much you have to 0-1 drink a day. ? Be aware of how much alcohol is in your drink. In the [place] one drink equals one 12 oz bottle of beer (355 mL), one 5 oz glass of wine (148 mL), or one 1 oz glass of hard liquor (44 mL). Lifestyle  Take daily care of your teeth and gums.  Stay active. Exercise for at least 30 minutes on 5 or more days each week.  Do not use any products that contain nicotine or tobacco, such as cigarettes, e-cigarettes, and chewing tobacco. If you need help quitting, ask your health care provider.  If you are sexually active, practice safe sex. Use a condom or other form of protection in order to prevent STIs (sexually transmitted infections).  Talk with your health care provider about taking a low-dose aspirin or statin. What's next?  Go to your health care provider once a year for a well check visit.  Ask your health care provider how often you should have your eyes and teeth checked.  Stay up to date on all vaccines. This information is not intended to replace advice given to you by your health care provider. Make sure you discuss any questions you have with your health care provider. Document Revised: 10/09/2018 Document Reviewed: 10/09/2018 Elsevier Patient Education  2020 [employer].

## 2020-05-04 NOTE — Progress Notes (Signed)
GYNECOLOGY ANNUAL PREVENTATIVE CARE ENCOUNTER NOTE  History:     Kelly Lozano is a 69 y.o. G2P2 female here for a routine annual gynecologic exam.  Current complaints: none.  History of TAH/BSO for endometriosis, has bene on long term estrogen HRT.  Tried to wean off, but had severe symptoms. On Estradiol 0.5 mg daily, desires refill. Understands risks of HRT, wants to continue this.   Denies abnormal vaginal bleeding, discharge, pelvic pain, problems with intercourse or other gynecologic concerns.    Gynecologic History No LMP recorded. Patient has had a hysterectomy. Last mammogram: 01/29/2020. Results were: normal  Obstetric History OB History  Gravida Para Term Preterm AB Living  2 2       2   SAB TAB Ectopic Multiple Live Births               # Outcome Date GA Lbr Len/2nd Weight Sex Delivery Anes PTL Lv  2 Para           1 Para             Past Medical History:  Diagnosis Date  . Arthritis   . Cystitis, interstitial   . Depression   . Elevated cholesterol   . GERD (gastroesophageal reflux disease)   . Hemangioma of liver   . Hypertension   . Kidney stones   . Osteopenia 2018   hip  . Stricture esophagus     Past Surgical History:  Procedure Laterality Date  . APPENDECTOMY    . AUGMENTATION MAMMAPLASTY    . basil cell excised left arm  2012 Left   . CHOLECYSTECTOMY  2010   during liver sx  . COLONOSCOPY    . EYE SURGERY  10/2012   fatty tissue removed and graft from eyeball  . KNEE ARTHROSCOPY Left 02/20/2017  . KNEE ARTHROSCOPY Right 1980's  . LIVER RESECTION  2010   1/2 liver removed at [facility] for hemangioma  . TOTAL ABDOMINAL HYSTERECTOMY W/ BILATERAL SALPINGOOPHORECTOMY     Endometriosis  . TUBAL LIGATION    . UPPER GASTROINTESTINAL ENDOSCOPY      Current Outpatient Medications on File Prior to Visit  Medication Sig Dispense Refill  . ALPRAZolam (XANAX) 0.25 MG tablet Take 1 tablet by mouth as needed.    . Cholecalciferol (VITAMIN D3) 2000 units  capsule Take 2,000 Units by mouth daily.    . hydrochlorothiazide (HYDRODIURIL) 25 MG tablet Take 1 tablet by mouth Daily.    Marland Kitchen omeprazole (PRILOSEC) 20 MG capsule TAKE 1 CAPSULE (20 MG TOTAL) BY MOUTH DAILY. 30 capsule 11  . rosuvastatin (CRESTOR) 10 MG tablet Take 1 tablet by mouth. Takes 1 weekly    . ZETIA 10 MG tablet Take 1 tablet by mouth Daily.     Current Facility-Administered Medications on File Prior to Visit  Medication Dose Route Frequency Provider Last Rate Last Admin  . 0.9 %  sodium chloride infusion  500 mL Intravenous Continuous Nandigam, Venia Minks, MD        Allergies  Allergen Reactions  . Nickel Other (See Comments)    "blister"  . Other     All opiod medications cause n/v, diarrhea, dizziness, swelling, pass out  . Tdap [Tetanus-Diphth-Acell Pertussis] Swelling    Social History:  reports that she has never smoked. She has never used smokeless tobacco. She reports that she does not drink alcohol and does not use drugs.  Family History  Problem Relation Age of Onset  . Colon cancer  Father 92  . Diabetes Mother   . Hypertension Mother   . Esophageal cancer Neg Hx   . Rectal cancer Neg Hx   . Stomach cancer Neg Hx     The following portions of the patient's history were reviewed and updated as appropriate: allergies, current medications, past family history, past medical history, past social history, past surgical history and problem list.  Review of Systems Pertinent items noted in HPI and remainder of comprehensive ROS otherwise negative.  Physical Exam:  BP (!) 163/85   Pulse 70   Ht 5\' 1"  (1.549 m)   Wt 153 lb 11.2 oz (69.7 kg)   BMI 29.04 kg/m  CONSTITUTIONAL: Well-developed, well-nourished female in no acute distress.  HENT:  Normocephalic, atraumatic, External right and left ear normal. Oropharynx is clear and moist EYES: Conjunctivae and EOM are normal. Pupils are equal, round, and reactive to light. No scleral icterus.  NECK: Normal range of  motion, supple, no masses.  Normal thyroid.  SKIN: Skin is warm and dry. No rash noted. Not diaphoretic. No erythema. No pallor. MUSCULOSKELETAL: Normal range of motion. No tenderness.  No cyanosis, clubbing, or edema.  2+ distal pulses. NEUROLOGIC: Alert and oriented to person, place, and time. Normal reflexes, muscle tone coordination.  PSYCHIATRIC: Normal mood and affect. Normal behavior. Normal judgment and thought content. CARDIOVASCULAR: Normal heart rate noted, regular rhythm RESPIRATORY: Clear to auscultation bilaterally. Effort and breath sounds normal, no problems with respiration noted. BREASTS: Symmetric in size, implants in place. No masses, tenderness, skin changes, nipple drainage, or lymphadenopathy bilaterally. Performed in the presence of a chaperone. ABDOMEN: Soft, no distention noted.  No tenderness, rebound or guarding.  PELVIC: Deferred   Assessment and Plan:    1. Current long-term use of postmenopausal hormone replacement therapy Estradiol refilled for patient.  - estradiol (ESTRACE) 0.5 MG tablet; Take 1 tablet (0.5 mg total) by mouth daily. Takes 1/2 tablet daily  Dispense: 90 tablet; Refill: 5  2. Well woman exam with routine gynecological exam Normal exam today. Routine preventative health maintenance measures emphasized. Please refer to After Visit Summary for other counseling recommendations.      Verita Schneiders, MD, [place] [facility] [employer], [place]

## 2020-05-12 DIAGNOSIS — H2513 Age-related nuclear cataract, bilateral: Secondary | ICD-10-CM | POA: Diagnosis not present

## 2020-05-12 DIAGNOSIS — H33103 Unspecified retinoschisis, bilateral: Secondary | ICD-10-CM | POA: Diagnosis not present

## 2020-10-01 DIAGNOSIS — Z23 Encounter for immunization: Secondary | ICD-10-CM | POA: Diagnosis not present

## 2020-10-07 DIAGNOSIS — Z79899 Other long term (current) drug therapy: Secondary | ICD-10-CM | POA: Diagnosis not present

## 2020-10-07 DIAGNOSIS — E569 Vitamin deficiency, unspecified: Secondary | ICD-10-CM | POA: Diagnosis not present

## 2020-10-07 DIAGNOSIS — I10 Essential (primary) hypertension: Secondary | ICD-10-CM | POA: Diagnosis not present

## 2020-10-07 DIAGNOSIS — F419 Anxiety disorder, unspecified: Secondary | ICD-10-CM | POA: Diagnosis not present

## 2020-10-07 DIAGNOSIS — E559 Vitamin D deficiency, unspecified: Secondary | ICD-10-CM | POA: Diagnosis not present

## 2020-10-07 DIAGNOSIS — D696 Thrombocytopenia, unspecified: Secondary | ICD-10-CM | POA: Diagnosis not present

## 2020-10-07 DIAGNOSIS — E785 Hyperlipidemia, unspecified: Secondary | ICD-10-CM | POA: Diagnosis not present

## 2020-10-14 DIAGNOSIS — E785 Hyperlipidemia, unspecified: Secondary | ICD-10-CM | POA: Diagnosis not present

## 2020-10-14 DIAGNOSIS — I10 Essential (primary) hypertension: Secondary | ICD-10-CM | POA: Diagnosis not present

## 2020-10-14 DIAGNOSIS — Z0000 Encounter for general adult medical examination without abnormal findings: Secondary | ICD-10-CM | POA: Diagnosis not present

## 2020-10-14 DIAGNOSIS — Z6830 Body mass index (BMI) 30.0-30.9, adult: Secondary | ICD-10-CM | POA: Diagnosis not present

## 2020-10-14 DIAGNOSIS — Z87738 Personal history of other specified (corrected) congenital malformations of digestive system: Secondary | ICD-10-CM | POA: Diagnosis not present

## 2020-12-14 ENCOUNTER — Other Ambulatory Visit: Payer: Self-pay | Admitting: Internal Medicine

## 2020-12-14 DIAGNOSIS — R2241 Localized swelling, mass and lump, right lower limb: Secondary | ICD-10-CM

## 2020-12-15 ENCOUNTER — Other Ambulatory Visit: Payer: Self-pay

## 2020-12-15 ENCOUNTER — Ambulatory Visit (HOSPITAL_COMMUNITY)
Admission: RE | Admit: 2020-12-15 | Discharge: 2020-12-15 | Disposition: A | Discharge status: Home / Self Care | Payer: Medicare Other | Source: Ambulatory Visit | Attending: Internal Medicine | Admitting: Internal Medicine

## 2020-12-15 DIAGNOSIS — R2241 Localized swelling, mass and lump, right lower limb: Secondary | ICD-10-CM | POA: Insufficient documentation

## 2020-12-15 DIAGNOSIS — R600 Localized edema: Secondary | ICD-10-CM | POA: Diagnosis not present

## 2021-04-05 ENCOUNTER — Other Ambulatory Visit: Payer: Self-pay | Admitting: Obstetrics and Gynecology

## 2021-04-05 ENCOUNTER — Other Ambulatory Visit: Payer: Self-pay | Admitting: Obstetrics & Gynecology

## 2021-04-05 DIAGNOSIS — Z1231 Encounter for screening mammogram for malignant neoplasm of breast: Secondary | ICD-10-CM

## 2021-04-10 DIAGNOSIS — E785 Hyperlipidemia, unspecified: Secondary | ICD-10-CM | POA: Diagnosis not present

## 2021-04-10 DIAGNOSIS — Z79899 Other long term (current) drug therapy: Secondary | ICD-10-CM | POA: Diagnosis not present

## 2021-04-17 DIAGNOSIS — E785 Hyperlipidemia, unspecified: Secondary | ICD-10-CM | POA: Diagnosis not present

## 2021-04-17 DIAGNOSIS — I10 Essential (primary) hypertension: Secondary | ICD-10-CM | POA: Diagnosis not present

## 2021-06-05 ENCOUNTER — Ambulatory Visit
Admission: RE | Admit: 2021-06-05 | Discharge: 2021-06-05 | Disposition: A | Discharge status: Home / Self Care | Payer: Medicare Other | Source: Ambulatory Visit | Attending: Obstetrics & Gynecology | Admitting: Obstetrics & Gynecology

## 2021-06-05 ENCOUNTER — Other Ambulatory Visit: Payer: Self-pay

## 2021-06-05 DIAGNOSIS — Z1231 Encounter for screening mammogram for malignant neoplasm of breast: Secondary | ICD-10-CM | POA: Diagnosis not present

## 2021-09-07 DIAGNOSIS — Z23 Encounter for immunization: Secondary | ICD-10-CM | POA: Diagnosis not present

## 2021-10-04 DIAGNOSIS — H2513 Age-related nuclear cataract, bilateral: Secondary | ICD-10-CM | POA: Diagnosis not present

## 2021-10-17 DIAGNOSIS — E559 Vitamin D deficiency, unspecified: Secondary | ICD-10-CM | POA: Diagnosis not present

## 2021-10-17 DIAGNOSIS — E785 Hyperlipidemia, unspecified: Secondary | ICD-10-CM | POA: Diagnosis not present

## 2021-10-17 DIAGNOSIS — Z79899 Other long term (current) drug therapy: Secondary | ICD-10-CM | POA: Diagnosis not present

## 2021-10-17 DIAGNOSIS — I10 Essential (primary) hypertension: Secondary | ICD-10-CM | POA: Diagnosis not present

## 2021-10-17 DIAGNOSIS — F419 Anxiety disorder, unspecified: Secondary | ICD-10-CM | POA: Diagnosis not present

## 2021-10-24 DIAGNOSIS — I10 Essential (primary) hypertension: Secondary | ICD-10-CM | POA: Diagnosis not present

## 2021-10-24 DIAGNOSIS — Z0001 Encounter for general adult medical examination with abnormal findings: Secondary | ICD-10-CM | POA: Diagnosis not present

## 2021-10-24 DIAGNOSIS — E785 Hyperlipidemia, unspecified: Secondary | ICD-10-CM | POA: Diagnosis not present

## 2021-10-24 DIAGNOSIS — Z6830 Body mass index (BMI) 30.0-30.9, adult: Secondary | ICD-10-CM | POA: Diagnosis not present

## 2022-05-24 ENCOUNTER — Encounter: Payer: Self-pay | Admitting: Gastroenterology

## 2022-08-29 ENCOUNTER — Other Ambulatory Visit: Payer: Self-pay | Admitting: Obstetrics & Gynecology

## 2022-08-29 ENCOUNTER — Encounter: Payer: Self-pay | Admitting: Gastroenterology

## 2022-08-29 DIAGNOSIS — Z1231 Encounter for screening mammogram for malignant neoplasm of breast: Secondary | ICD-10-CM

## 2022-08-30 ENCOUNTER — Other Ambulatory Visit: Payer: Self-pay | Admitting: Internal Medicine

## 2022-08-30 DIAGNOSIS — N644 Mastodynia: Secondary | ICD-10-CM

## 2022-08-30 DIAGNOSIS — Z1231 Encounter for screening mammogram for malignant neoplasm of breast: Secondary | ICD-10-CM

## 2022-09-12 ENCOUNTER — Ambulatory Visit (AMBULATORY_SURGERY_CENTER): Payer: Self-pay

## 2022-09-12 VITALS — Ht 61.0 in | Wt 145.0 lb

## 2022-09-12 DIAGNOSIS — Z800 Family history of malignant neoplasm of digestive organs: Secondary | ICD-10-CM

## 2022-09-12 MED ORDER — NA SULFATE-K SULFATE-MG SULF 17.5-3.13-1.6 GM/177ML PO SOLN: 1.0000 | Freq: Once | ORAL | 0 refills | Status: AC

## 2022-09-12 NOTE — Progress Notes (Signed)
No egg or soy allergy known to patient  No issues known to pt with past sedation with any surgeries or procedures---"I am a lightweight with anesthesia" Patient denies ever being told they had issues or difficulty with intubation;  No FH of Malignant Hyperthermia; Pt is not on diet pills; Pt is not on home 02;  Pt is not on blood thinners;  Pt denies issues with constipation  No A fib or A flutter Have any cardiac testing pending--NO Pt instructed to use Singlecare.com or GoodRx for a price reduction on prep   Insurance verified during [place] appt=Medicare  Patient's chart reviewed by Kelly Lozano CNRA prior to previsit and patient appropriate for the [place].  Previsit completed and red dot placed by patient's name on their procedure day (on provider's schedule).    GoodRx coupon given to patient during [facility] appt;

## 2022-10-08 DIAGNOSIS — H33103 Unspecified retinoschisis, bilateral: Secondary | ICD-10-CM | POA: Diagnosis not present

## 2022-10-08 DIAGNOSIS — H2513 Age-related nuclear cataract, bilateral: Secondary | ICD-10-CM | POA: Diagnosis not present

## 2022-10-10 ENCOUNTER — Encounter: Payer: Self-pay | Admitting: Certified Registered Nurse Anesthetist

## 2022-10-11 ENCOUNTER — Other Ambulatory Visit: Payer: Self-pay | Admitting: Gastroenterology

## 2022-10-11 ENCOUNTER — Encounter: Payer: Self-pay | Admitting: Gastroenterology

## 2022-10-11 ENCOUNTER — Ambulatory Visit (AMBULATORY_SURGERY_CENTER): Payer: Medicare Other | Admitting: Gastroenterology

## 2022-10-11 VITALS — BP 113/88 | HR 62 | Temp 97.7°F | Resp 12 | Ht 61.0 in | Wt 145.0 lb

## 2022-10-11 DIAGNOSIS — F419 Anxiety disorder, unspecified: Secondary | ICD-10-CM | POA: Diagnosis not present

## 2022-10-11 DIAGNOSIS — Z09 Encounter for follow-up examination after completed treatment for conditions other than malignant neoplasm: Secondary | ICD-10-CM

## 2022-10-11 DIAGNOSIS — D123 Benign neoplasm of transverse colon: Secondary | ICD-10-CM

## 2022-10-11 DIAGNOSIS — D122 Benign neoplasm of ascending colon: Secondary | ICD-10-CM | POA: Diagnosis not present

## 2022-10-11 DIAGNOSIS — Z1211 Encounter for screening for malignant neoplasm of colon: Secondary | ICD-10-CM | POA: Diagnosis not present

## 2022-10-11 DIAGNOSIS — D125 Benign neoplasm of sigmoid colon: Secondary | ICD-10-CM

## 2022-10-11 DIAGNOSIS — I10 Essential (primary) hypertension: Secondary | ICD-10-CM | POA: Diagnosis not present

## 2022-10-11 DIAGNOSIS — D128 Benign neoplasm of rectum: Secondary | ICD-10-CM | POA: Diagnosis not present

## 2022-10-11 DIAGNOSIS — D127 Benign neoplasm of rectosigmoid junction: Secondary | ICD-10-CM | POA: Diagnosis not present

## 2022-10-11 DIAGNOSIS — Z800 Family history of malignant neoplasm of digestive organs: Secondary | ICD-10-CM

## 2022-10-11 DIAGNOSIS — F32A Depression, unspecified: Secondary | ICD-10-CM | POA: Diagnosis not present

## 2022-10-11 MED ORDER — DICYCLOMINE HCL 10 MG PO CAPS: 10.0000 mg | ORAL_CAPSULE | Freq: Three times a day (TID) | ORAL | 0 refills | Status: DC | PRN

## 2022-10-11 MED ORDER — SODIUM CHLORIDE 0.9 % IV SOLN: 500.0000 mL | Freq: Once | INTRAVENOUS | Status: AC

## 2022-10-11 NOTE — Op Note (Signed)
[facility] Patient Name: Kelly Lozano Procedure Date: 10/11/2022 7:51 AM MRN: [id number] Endoscopist: Mauri Pole , MD, [id number] Age: 71 Referring MD:  Date of Birth: [birth year] Gender: Female Account #: [id number] Procedure:                Colonoscopy Indications:              Screening in patient at increased risk: Family                            history of 1st-degree relative with colorectal                            cancer Medicines:                Monitored Anesthesia Care Procedure:                Pre-Anesthesia Assessment:                           - Prior to the procedure, a History and Physical                            was performed, and patient medications and                            allergies were reviewed. The patient's tolerance of                            previous anesthesia was also reviewed. The risks                            and benefits of the procedure and the sedation                            options and risks were discussed with the patient.                            All questions were answered, and informed consent                            was obtained. Prior Anticoagulants: The patient has                            taken no anticoagulant or antiplatelet agents. ASA                            Grade Assessment: II - A patient with mild systemic                            disease. After reviewing the risks and benefits,                            the patient was deemed in satisfactory condition to  undergo the procedure.                           After obtaining informed consent, the colonoscope                            was passed under direct vision. Throughout the                            procedure, the patient's blood pressure, pulse, and                            oxygen saturations were monitored continuously. The                            PCF-HQ190L Colonoscope was introduced through the                             anus and advanced to the the cecum, identified by                            appendiceal orifice and ileocecal valve. The                            colonoscopy was performed without difficulty. The                            patient tolerated the procedure well. The quality                            of the bowel preparation was good. The ileocecal                            valve, appendiceal orifice, and rectum were                            photographed. Scope In: 8:06:32 AM Scope Out: 8:23:24 AM Scope Withdrawal Time: 0 hours 14 minutes 26 seconds  Total Procedure Duration: 0 hours 16 minutes 52 seconds  Findings:                 The perianal and digital rectal examinations were                            normal.                           Three sessile polyps were found in the rectum,                            sigmoid colon and transverse colon. The polyps were                            4 to 8 mm in size. These polyps were removed with a  cold snare. Resection and retrieval were complete.                           A less than 1 mm polyp was found in the ascending                            colon. The polyp was sessile. The polyp was removed                            with a cold biopsy forceps. Resection and retrieval                            were complete.                           Scattered large-mouthed, medium-mouthed and                            small-mouthed diverticula were found in the sigmoid                            colon and descending colon.                           Non-bleeding external and internal hemorrhoids were                            found during retroflexion. The hemorrhoids were                            medium-sized. Complications:            No immediate complications. Estimated Blood Loss:     Estimated blood loss was minimal. Impression:               - Three 4 to 8 mm polyps in the rectum,  in the                            sigmoid colon and in the transverse colon, removed                            with a cold snare. Resected and retrieved.                           - One less than 1 mm polyp in the ascending colon,                            removed with a cold biopsy forceps. Resected and                            retrieved.                           - Diverticulosis in the sigmoid colon and in the  descending colon.                           - Non-bleeding external and internal hemorrhoids. Recommendation:           - Patient has a contact number available for                            emergencies. The signs and symptoms of potential                            delayed complications were discussed with the                            patient. Return to normal activities tomorrow.                            Written discharge instructions were provided to the                            patient.                           - Resume previous diet.                           - Continue present medications.                           - Await pathology results.                           - Repeat colonoscopy in 3 - 5 years for                            surveillance based on pathology results. Mauri Pole, MD 10/11/2022 8:36:34 AM This report has been signed electronically.

## 2022-10-11 NOTE — Progress Notes (Signed)
Called to room to assist during endoscopic procedure.  Patient ID and intended procedure confirmed with present staff. Received instructions for my participation in the procedure from the performing physician.  

## 2022-10-11 NOTE — Patient Instructions (Addendum)
RECOMMENDATIONS: - Patient has a contact number available for emergencies. The signs and symptoms of potential delayed complications were discussed with the patient. Return to normal activities tomorrow. Written discharge instructions were provided to the patient. - Resume previous diet. - Continue present medications. - Await pathology results. - Repeat colonoscopy in 3 - 5 years for surveillance based on pathology results.   YOU HAD AN ENDOSCOPIC PROCEDURE TODAY AT [facility]:   Refer to the procedure report that was given to you for any specific questions about what was found during the examination.  If the procedure report does not answer your questions, please call your gastroenterologist to clarify.  If you requested that your care partner not be given the details of your procedure findings, then the procedure report has been included in a sealed envelope for you to review at your convenience later.  YOU SHOULD EXPECT: Some feelings of bloating in the abdomen. Passage of more gas than usual.  Walking can help get rid of the air that was put into your GI tract during the procedure and reduce the bloating. If you had a lower endoscopy (such as a colonoscopy or flexible sigmoidoscopy) you may notice spotting of blood in your stool or on the toilet paper. If you underwent a bowel prep for your procedure, you may not have a normal bowel movement for a few days.  Please Note:  You might notice some irritation and congestion in your nose or some drainage.  This is from the oxygen used during your procedure.  There is no need for concern and it should clear up in a day or so.  SYMPTOMS TO REPORT IMMEDIATELY:  Following lower endoscopy (colonoscopy or flexible sigmoidoscopy):  Excessive amounts of blood in the stool  Significant tenderness or worsening of abdominal pains  Swelling of the abdomen that is new, acute  Fever of 100F or higher   For urgent or emergent issues, a  gastroenterologist can be reached at any hour by calling [phone]. Do not use MyChart messaging for urgent concerns.    DIET:  We do recommend a small meal at first, but then you may proceed to your regular diet.  Drink plenty of fluids but you should avoid alcoholic beverages for 24 hours.  MEDICATIONS: Continue present medications.  Please see handouts given to you by your recovery nurse: polyps, diverticulosis, hemorrhoids.  Thank you for allowing [place] to provide for your healthcare needs today.  ACTIVITY:  You should plan to take it easy for the rest of today and you should NOT DRIVE or use heavy machinery until tomorrow (because of the sedation medicines used during the test).    FOLLOW UP: Our staff will call the number listed on your records the next business day following your procedure.  We will call around 7:15- 8:00 am to check on you and address any questions or concerns that you may have regarding the information given to you following your procedure. If we do not reach you, we will leave a message.     If any biopsies were taken you will be contacted by phone or by letter within the next 1-3 weeks.  Please call us at [phone] if you have not heard about the biopsies in 3 weeks.    SIGNATURES/CONFIDENTIALITY: You and/or your care partner have signed paperwork which will be entered into your electronic medical record.  These signatures attest to the fact that that the information above on your After Visit Summary  has been reviewed and is understood.  Full responsibility of the confidentiality of this discharge information lies with you and/or your care-partner. 

## 2022-10-11 NOTE — Progress Notes (Signed)
Report given to PACU, vss 

## 2022-10-11 NOTE — Progress Notes (Signed)
[facility] History and Physical   Primary Care Physician:  Asencion Noble, MD   Reason for Procedure:  Family history of colon cancer  Plan:    Screening colonoscopy with possible interventions as needed     HPI: Kelly Lozano is a very pleasant 71 y.o. female here for screening colonoscopy. Denies any nausea, vomiting, abdominal pain, melena or bright red blood per rectum  The risks and benefits as well as alternatives of endoscopic procedure(s) have been discussed and reviewed. All questions answered. The patient agrees to proceed.    Past Medical History:  Diagnosis Date   Anxiety    situational anxiety   Arthritis    Cystitis, interstitial    Depression    Elevated cholesterol    on meds   GERD (gastroesophageal reflux disease)    on meds   Hemangioma of liver    Hypertension    on meds   Idiopathic thrombocytopenia ([place]) 1977   during third trimester of prgnancy   Kidney stones    Osteopenia 2018   hip   Stricture esophagus     Past Surgical History:  Procedure Laterality Date   [place]   BASAL CELL CARCINOMA EXCISION Left 2012   CHOLECYSTECTOMY  2010   during liver sx   COLONOSCOPY  2018   KN-MAC-MAC-suprep(exc)-tics/int hems/HHPx1-19yrrecall   EYE SURGERY  10/2012   fatty tissue removed and graft from eyeball   KNEE ARTHROSCOPY Left 02/20/2017   KNEE ARTHROSCOPY Right 1980   [facility]  2010   [facility]   TOTAL ABDOMINAL HYSTERECTOMY W/ BILATERAL SALPINGOOPHORECTOMY  1978   [place]ometriosis   [place]   Prior to Admission medications   Medication Sig Start Date End Date Taking? Authorizing Provider  Cholecalciferol (VITAMIN D3) 2000 units capsule Take 2,000 Units by mouth 3 (three) times a week.   Yes [provider]  estradiol (ESTRACE) 0.5 MG tablet Take 1 tablet  (0.5 mg total) by mouth daily. Takes 1/2 tablet daily Patient taking differently: Take 0.25 mg by mouth daily. Takes 1/2 tab daily for a total of 0.25 mg daily 05/04/20  Yes Anyanwu, Ugonna A, MD  hydrochlorothiazide (HYDRODIURIL) 25 MG tablet Take 1 tablet by mouth Daily. 02/14/12  Yes [provider]  omeprazole (PRILOSEC) 20 MG capsule TAKE 1 CAPSULE (20 MG TOTAL) BY MOUTH DAILY. 03/28/13  Yes BLafayette Dragon MD  ZETIA 10 MG tablet Take 1 tablet by mouth Daily. 02/14/12  Yes [provider]  ALPRAZolam (Duanne Moron 0.25 MG tablet Take 1 tablet by mouth as needed. 07/01/18   [provider]  rosuvastatin (CRESTOR) 10 MG tablet Take 1 tablet by mouth once a week. Takes 1 weekly 09/26/18   [provider]    Current Outpatient Medications  Medication Sig Dispense Refill   Cholecalciferol (VITAMIN D3) 2000 units capsule Take 2,000 Units by mouth 3 (three) times a week.     estradiol (ESTRACE) 0.5 MG tablet Take 1 tablet (0.5 mg total) by mouth daily. Takes 1/2 tablet daily (Patient taking differently: Take 0.25 mg by mouth daily. Takes 1/2 tab daily for a total of 0.25 mg daily) 90 tablet 5   hydrochlorothiazide (HYDRODIURIL) 25 MG tablet Take 1 tablet by mouth Daily.     omeprazole (PRILOSEC) 20 MG capsule TAKE 1 CAPSULE (20 MG  TOTAL) BY MOUTH DAILY. 30 capsule 11   ZETIA 10 MG tablet Take 1 tablet by mouth Daily.     ALPRAZolam (XANAX) 0.25 MG tablet Take 1 tablet by mouth as needed.     rosuvastatin (CRESTOR) 10 MG tablet Take 1 tablet by mouth once a week. Takes 1 weekly     Current Facility-Administered Medications  Medication Dose Route Frequency Provider Last Rate Last Admin   0.9 %  sodium chloride infusion  500 mL Intravenous Once Mauri Pole, MD        Allergies as of 10/11/2022 - Review Complete 10/11/2022  Allergen Reaction Noted   Nickel Other (See Comments) 03/07/2012   Other  04/12/2017   Tdap [tetanus-diphth-acell pertussis] Swelling  03/25/2013    Family History  Problem Relation Age of Onset   Diabetes Mother    Hypertension Mother    Colon polyps Father 43   Colon cancer Father 34   Esophageal cancer Neg Hx    Rectal cancer Neg Hx    Stomach cancer Neg Hx     Social History   Socioeconomic History   Marital status: Married    Spouse name: Not on file   Number of children: Not on file   Years of education: Not on file   Highest education level: Not on file  Occupational History   Not on file  Tobacco Use   Smoking status: Never   Smokeless tobacco: Never  Vaping Use   Vaping Use: Never used  Substance and Sexual Activity   Alcohol use: Not Currently    Alcohol/week: 0.0 - 7.0 standard drinks of alcohol    Comment: 1 glass of wine a week   Drug use: No   Sexual activity: Yes    Partners: Male    Birth control/protection: Post-menopausal, Surgical    Comment: TAH/BSO  Other Topics Concern   Not on file  Social History Narrative   Not on file   Social Determinants of Health   Financial Resource Strain: Not on file  Food Insecurity: No Food Insecurity (05/04/2020)   Hunger Vital Sign    Worried About Running Out of Food in the Last Year: Never true    Ran Out of Food in the Last Year: Never true  Transportation Needs: No Transportation Needs (05/04/2020)   PRAPARE - Hydrologist (Medical): No    Lack of Transportation (Non-Medical): No  Physical Activity: Not on file  Stress: Not on file  Social Connections: Not on file  Intimate Partner Violence: Not on file    Review of Systems:  All other review of systems negative except as mentioned in the HPI.  Physical Exam: Vital signs in last 24 hours: Blood Pressure 137/68   Pulse (Abnormal) 47   Temperature 97.7 F (36.5 C)   Respiration 16   Height _0  (1.549 m)   Weight 145 lb (65.8 kg)   Oxygen Saturation 96%   Body Mass Index 27.40 kg/m  General:   Alert, NAD Lungs:  Clear .   Heart:  Regular rate  and rhythm Abdomen:  Soft, nontender and nondistended. Neuro/Psych:  Alert and cooperative. Normal mood and affect. A and O x 3  Reviewed labs, radiology imaging, old records and pertinent past GI work up  Patient is appropriate for planned procedure(s) and anesthesia in an ambulatory setting   K. Denzil Magnuson , MD [phone]

## 2022-10-11 NOTE — Progress Notes (Signed)
Patient received in recovery post colonoscopy, VSS, patient is passing air, but abd is firm and patient is in apparent distress/discomfort, adm Levsin 0.'25mg'$  SL and Simethicone (per [facility] protocol), patient repositioned and turned from left to right, then back to left over period of about 15 minutes without relief, alerted Dr. Silverio Decamp who advised inserting a rectal tube, rectal tube inserted with return of air, patient reports some relief, but minimal, Dr. Silverio Decamp back to assess patient where she again repositioned and removed rectal tube, patient is passing air again and slowing achieving relief, ambulated patient to bathroom where she continued to pass more air, at time of discharge patient reports relief of initial pain from 10/10 to now 4/10 and is able to smile and converse with staff now. Patient given discharge instructions with our 24/7 phone number and was told to report to ER if her pain worsens again or does not continue to trend down (per Dr. Woodward Ku instructions). Patient and her husband verbalize understanding.

## 2022-10-12 ENCOUNTER — Telehealth: Payer: Self-pay | Admitting: *Deleted

## 2022-10-12 ENCOUNTER — Encounter: Payer: Self-pay | Admitting: Gastroenterology

## 2022-10-12 NOTE — Telephone Encounter (Signed)
  Follow up Call-     10/11/2022    7:21 AM  Call back number  Post procedure Call Back phone  # [phone]  Permission to leave phone message Yes    No. Patient questions:  Do you have a fever, pain , or abdominal swelling? No. Pain Score  0 *  Have you tolerated food without any problems? Yes.    Have you been able to return to your normal activities? Yes.    Do you have any questions about your discharge instructions: Diet   No. Medications  No. Follow up visit  No.  Do you have questions or concerns about your Care? No.  Actions: * If pain score is 4 or above: No action needed, pain <4.

## 2022-10-16 ENCOUNTER — Encounter: Payer: Self-pay | Admitting: Gastroenterology

## 2022-10-30 ENCOUNTER — Ambulatory Visit
Admission: RE | Admit: 2022-10-30 | Discharge: 2022-10-30 | Disposition: A | Discharge status: Home / Self Care | Payer: Medicare Other | Source: Ambulatory Visit | Attending: Internal Medicine | Admitting: Internal Medicine

## 2022-10-30 ENCOUNTER — Other Ambulatory Visit: Payer: Self-pay | Admitting: Internal Medicine

## 2022-10-30 DIAGNOSIS — N6489 Other specified disorders of breast: Secondary | ICD-10-CM | POA: Diagnosis not present

## 2022-10-30 DIAGNOSIS — N644 Mastodynia: Secondary | ICD-10-CM | POA: Diagnosis not present

## 2022-10-30 DIAGNOSIS — R928 Other abnormal and inconclusive findings on diagnostic imaging of breast: Secondary | ICD-10-CM

## 2022-10-30 DIAGNOSIS — R922 Inconclusive mammogram: Secondary | ICD-10-CM | POA: Diagnosis not present

## 2022-10-31 DIAGNOSIS — F419 Anxiety disorder, unspecified: Secondary | ICD-10-CM | POA: Diagnosis not present

## 2022-10-31 DIAGNOSIS — E559 Vitamin D deficiency, unspecified: Secondary | ICD-10-CM | POA: Diagnosis not present

## 2022-10-31 DIAGNOSIS — E785 Hyperlipidemia, unspecified: Secondary | ICD-10-CM | POA: Diagnosis not present

## 2022-10-31 DIAGNOSIS — Z79899 Other long term (current) drug therapy: Secondary | ICD-10-CM | POA: Diagnosis not present

## 2022-10-31 DIAGNOSIS — I10 Essential (primary) hypertension: Secondary | ICD-10-CM | POA: Diagnosis not present

## 2022-11-05 DIAGNOSIS — N920 Excessive and frequent menstruation with regular cycle: Secondary | ICD-10-CM | POA: Diagnosis not present

## 2022-11-05 DIAGNOSIS — E785 Hyperlipidemia, unspecified: Secondary | ICD-10-CM | POA: Diagnosis not present

## 2022-11-05 DIAGNOSIS — I10 Essential (primary) hypertension: Secondary | ICD-10-CM | POA: Diagnosis not present

## 2022-11-05 DIAGNOSIS — Z0000 Encounter for general adult medical examination without abnormal findings: Secondary | ICD-10-CM | POA: Diagnosis not present

## 2022-11-05 DIAGNOSIS — J019 Acute sinusitis, unspecified: Secondary | ICD-10-CM | POA: Diagnosis not present

## 2023-02-26 IMAGING — MG DIGITAL SCREENING BREAST BILAT IMPLANT W/ TOMO W/ CAD
8 of 15 series · 8 of 35 positions shown · non-contrast
Comparison: Previous exam(s).

CLINICAL DATA: Screening.

EXAM:
DIGITAL SCREENING BILATERAL MAMMOGRAM WITH IMPLANTS, CAD AND
TOMOSYNTHESIS
TECHNIQUE: Bilateral screening digital craniocaudal and mediolateral oblique
mammograms were obtained. Bilateral screening digital breast
tomosynthesis was performed. The images were evaluated with
computer-aided detection. Standard and/or implant displaced views
were performed.

[R CC (1 of 2)]
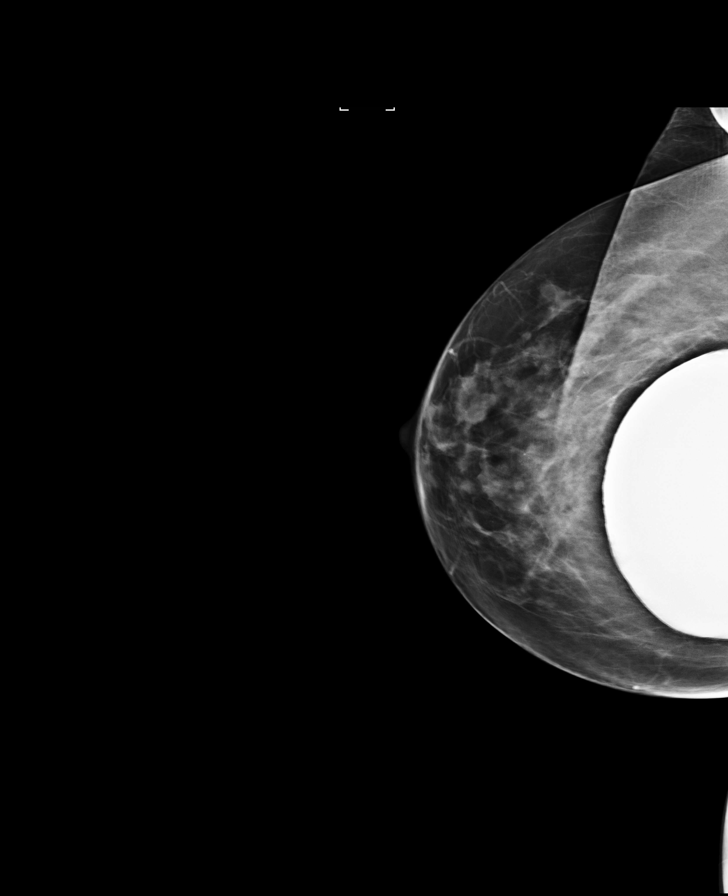

[L MLO]
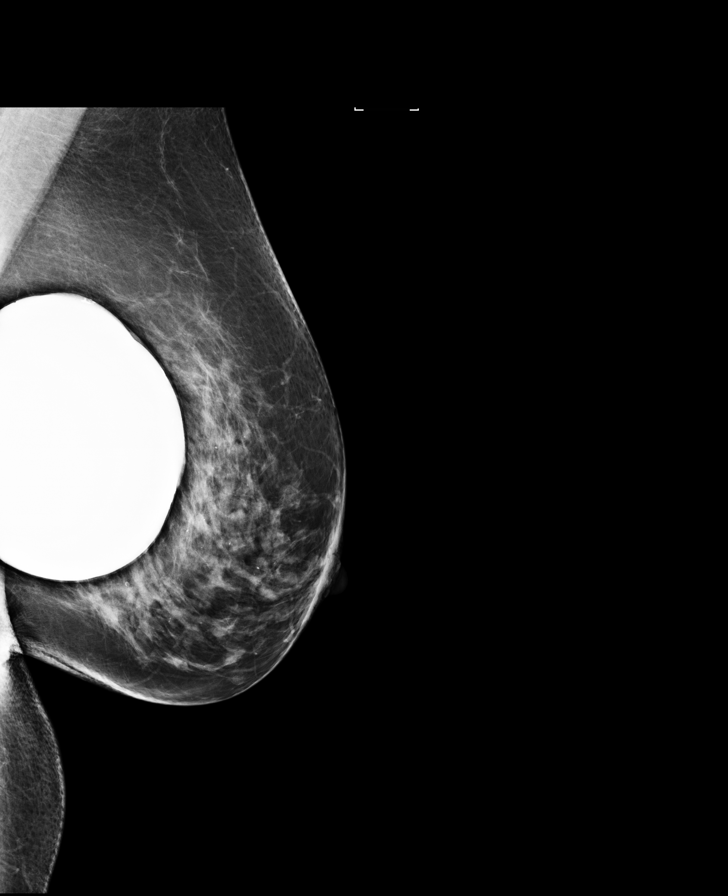

[R CC (2 of 2)]
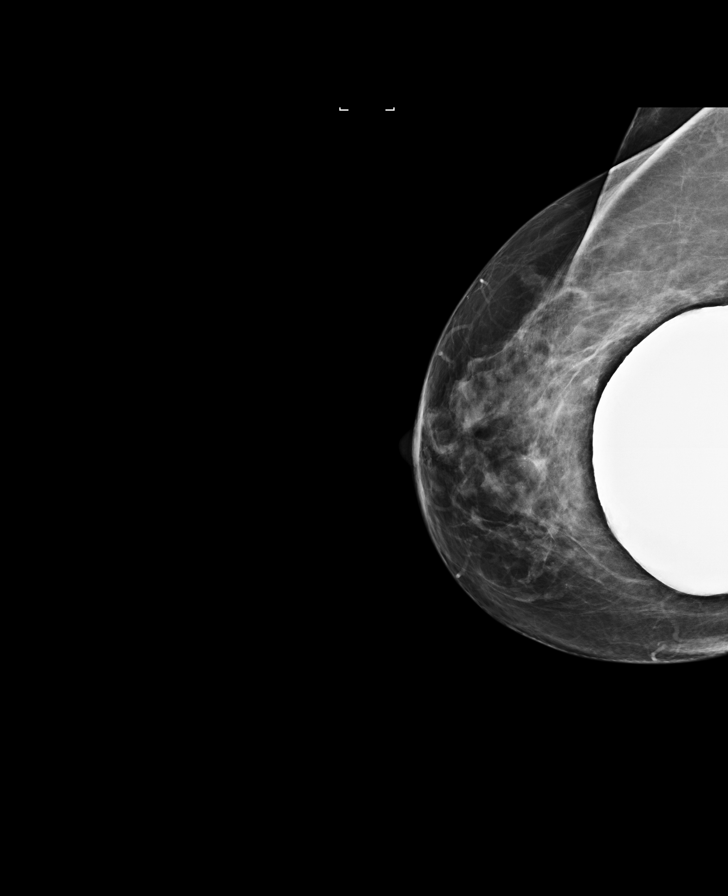

[L CC]
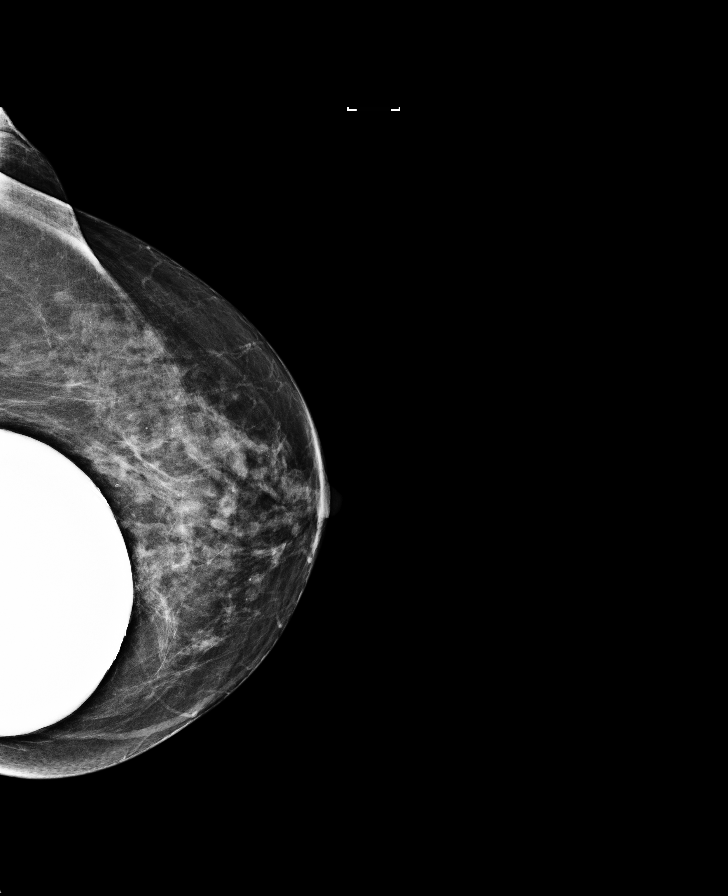

[R MLO]
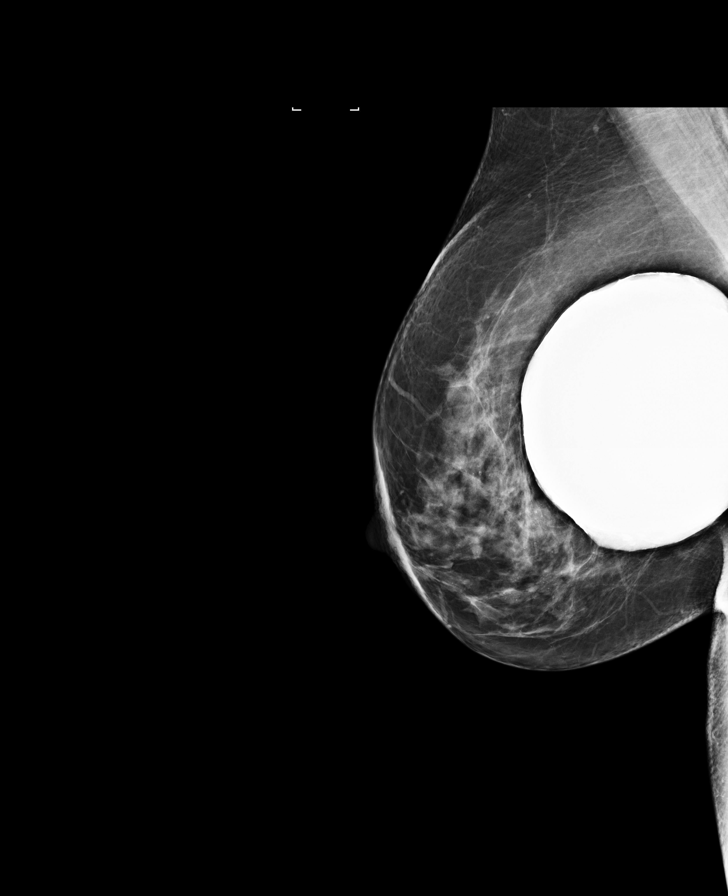

[R MLO synth-2D (1 of 2)]
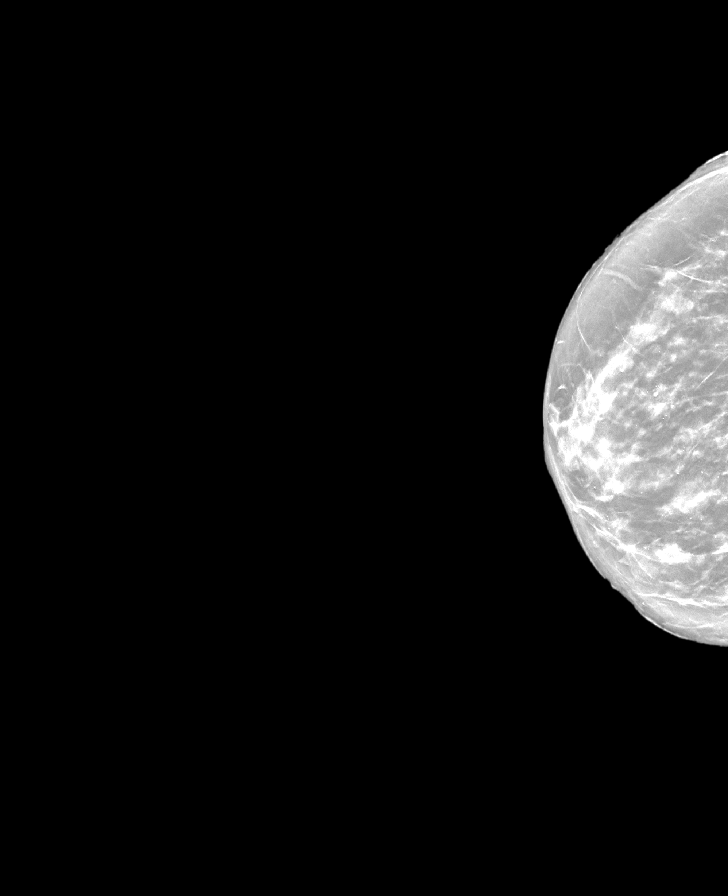

[R MLO synth-2D (2 of 2)]
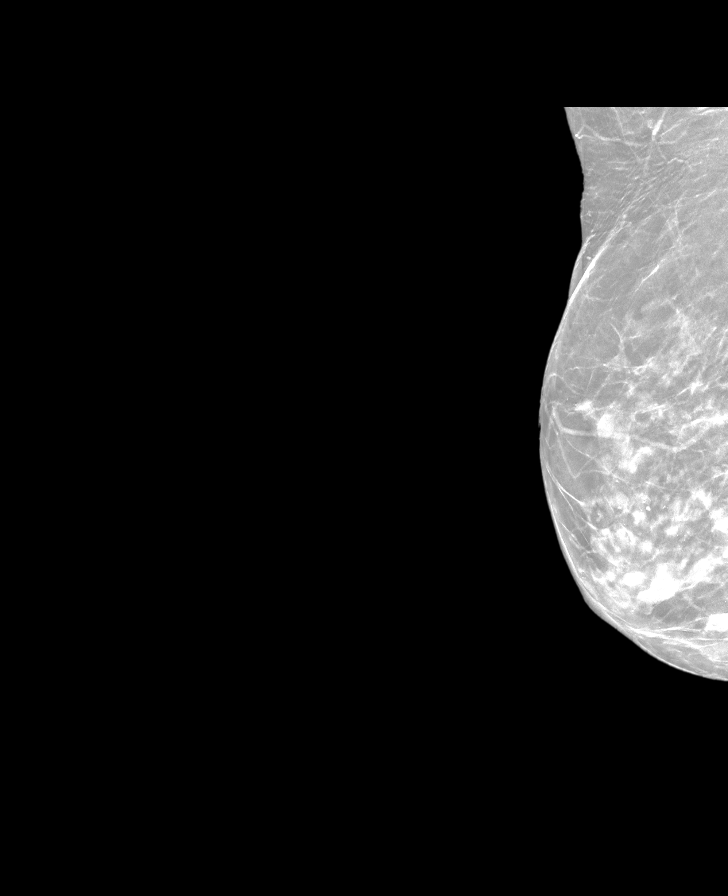

[L MLO synth-2D]
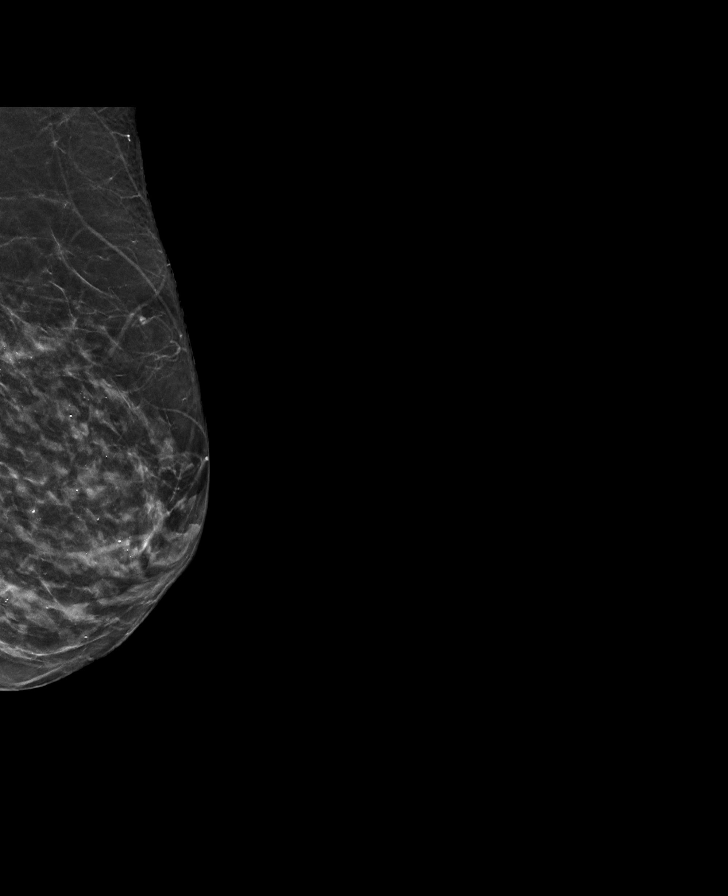

[8 of 35 positions shown; findings below may reference images not displayed]

ACR Breast Density Category c: The breast tissue is heterogeneously
dense, which may obscure small masses.
FINDINGS: The patient has implants. There are no findings suspicious for
malignancy.
IMPRESSION: No mammographic evidence of malignancy. A result letter of this
screening mammogram will be mailed directly to the patient.

RECOMMENDATION:
Screening mammogram in one year. (Code:WC-R-3IO)

BI-RADS CATEGORY  1:  Negative.

## 2023-09-30 DIAGNOSIS — L821 Other seborrheic keratosis: Secondary | ICD-10-CM | POA: Diagnosis not present

## 2023-09-30 DIAGNOSIS — Z85828 Personal history of other malignant neoplasm of skin: Secondary | ICD-10-CM | POA: Diagnosis not present

## 2023-09-30 DIAGNOSIS — L718 Other rosacea: Secondary | ICD-10-CM | POA: Diagnosis not present

## 2023-09-30 DIAGNOSIS — L980 Pyogenic granuloma: Secondary | ICD-10-CM | POA: Diagnosis not present

## 2023-09-30 DIAGNOSIS — D485 Neoplasm of uncertain behavior of skin: Secondary | ICD-10-CM | POA: Diagnosis not present

## 2023-11-04 DIAGNOSIS — Z79899 Other long term (current) drug therapy: Secondary | ICD-10-CM | POA: Diagnosis not present

## 2023-11-04 DIAGNOSIS — N200 Calculus of kidney: Secondary | ICD-10-CM | POA: Diagnosis not present

## 2023-11-04 DIAGNOSIS — E785 Hyperlipidemia, unspecified: Secondary | ICD-10-CM | POA: Diagnosis not present

## 2023-11-04 DIAGNOSIS — F419 Anxiety disorder, unspecified: Secondary | ICD-10-CM | POA: Diagnosis not present

## 2023-11-05 DIAGNOSIS — H33103 Unspecified retinoschisis, bilateral: Secondary | ICD-10-CM | POA: Diagnosis not present

## 2023-11-11 ENCOUNTER — Other Ambulatory Visit: Payer: Self-pay | Admitting: Internal Medicine

## 2023-11-11 DIAGNOSIS — R001 Bradycardia, unspecified: Secondary | ICD-10-CM | POA: Diagnosis not present

## 2023-11-11 DIAGNOSIS — I10 Essential (primary) hypertension: Secondary | ICD-10-CM | POA: Diagnosis not present

## 2023-11-11 DIAGNOSIS — Z0000 Encounter for general adult medical examination without abnormal findings: Secondary | ICD-10-CM

## 2023-11-11 DIAGNOSIS — E785 Hyperlipidemia, unspecified: Secondary | ICD-10-CM | POA: Diagnosis not present

## 2023-11-11 DIAGNOSIS — Z0001 Encounter for general adult medical examination with abnormal findings: Secondary | ICD-10-CM | POA: Diagnosis not present

## 2023-11-11 DIAGNOSIS — Z23 Encounter for immunization: Secondary | ICD-10-CM | POA: Diagnosis not present

## 2023-11-11 DIAGNOSIS — Q393 Congenital stenosis and stricture of esophagus: Secondary | ICD-10-CM | POA: Diagnosis not present

## 2023-11-13 ENCOUNTER — Encounter (HOSPITAL_COMMUNITY): Payer: Self-pay | Admitting: Internal Medicine

## 2023-11-13 ENCOUNTER — Other Ambulatory Visit (HOSPITAL_COMMUNITY): Payer: Self-pay | Admitting: Internal Medicine

## 2023-11-13 DIAGNOSIS — Z780 Asymptomatic menopausal state: Secondary | ICD-10-CM

## 2023-11-18 ENCOUNTER — Ambulatory Visit (HOSPITAL_COMMUNITY)
Admission: RE | Admit: 2023-11-18 | Discharge: 2023-11-18 | Disposition: A | Discharge status: Home / Self Care | Payer: Medicare Other | Source: Ambulatory Visit | Attending: Internal Medicine | Admitting: Internal Medicine

## 2023-11-18 DIAGNOSIS — Z780 Asymptomatic menopausal state: Secondary | ICD-10-CM | POA: Insufficient documentation

## 2023-11-18 DIAGNOSIS — M85851 Other specified disorders of bone density and structure, right thigh: Secondary | ICD-10-CM | POA: Diagnosis not present

## 2023-11-20 ENCOUNTER — Ambulatory Visit
Admission: RE | Admit: 2023-11-20 | Discharge: 2023-11-20 | Disposition: A | Discharge status: Home / Self Care | Payer: Medicare Other | Source: Ambulatory Visit | Attending: Internal Medicine | Admitting: Internal Medicine

## 2023-11-20 DIAGNOSIS — Z0000 Encounter for general adult medical examination without abnormal findings: Secondary | ICD-10-CM

## 2023-11-20 DIAGNOSIS — Z1231 Encounter for screening mammogram for malignant neoplasm of breast: Secondary | ICD-10-CM | POA: Diagnosis not present

## 2024-09-11 DIAGNOSIS — Z23 Encounter for immunization: Secondary | ICD-10-CM | POA: Diagnosis not present

## 2024-09-21 ENCOUNTER — Other Ambulatory Visit (HOSPITAL_COMMUNITY): Payer: Self-pay | Admitting: Internal Medicine

## 2024-09-21 DIAGNOSIS — I10 Essential (primary) hypertension: Secondary | ICD-10-CM | POA: Diagnosis not present

## 2024-09-21 DIAGNOSIS — N6452 Nipple discharge: Secondary | ICD-10-CM

## 2024-09-22 ENCOUNTER — Ambulatory Visit (HOSPITAL_COMMUNITY)
Admission: RE | Admit: 2024-09-22 | Discharge: 2024-09-22 | Disposition: A | Discharge status: Home / Self Care | Source: Ambulatory Visit | Attending: Internal Medicine | Admitting: Internal Medicine

## 2024-09-22 DIAGNOSIS — N6452 Nipple discharge: Secondary | ICD-10-CM | POA: Diagnosis not present

## 2024-09-22 DIAGNOSIS — R92333 Mammographic heterogeneous density, bilateral breasts: Secondary | ICD-10-CM | POA: Diagnosis not present

## 2024-09-23 ENCOUNTER — Other Ambulatory Visit (HOSPITAL_COMMUNITY): Payer: Self-pay | Admitting: Internal Medicine

## 2024-09-23 DIAGNOSIS — N6452 Nipple discharge: Secondary | ICD-10-CM

## 2024-09-23 DIAGNOSIS — B977 Papillomavirus as the cause of diseases classified elsewhere: Secondary | ICD-10-CM

## 2024-09-23 DIAGNOSIS — R208 Other disturbances of skin sensation: Secondary | ICD-10-CM

## 2024-10-01 ENCOUNTER — Ambulatory Visit (HOSPITAL_COMMUNITY): Admission: RE | Admit: 2024-10-01 | Discharge: 2024-10-01 | Attending: Internal Medicine | Admitting: Internal Medicine

## 2024-10-01 DIAGNOSIS — B977 Papillomavirus as the cause of diseases classified elsewhere: Secondary | ICD-10-CM | POA: Diagnosis not present

## 2024-10-01 DIAGNOSIS — N6452 Nipple discharge: Secondary | ICD-10-CM | POA: Insufficient documentation

## 2024-10-01 DIAGNOSIS — R208 Other disturbances of skin sensation: Secondary | ICD-10-CM | POA: Diagnosis not present

## 2024-10-01 DIAGNOSIS — N6325 Unspecified lump in the left breast, overlapping quadrants: Secondary | ICD-10-CM | POA: Diagnosis not present

## 2024-10-01 DIAGNOSIS — C50919 Malignant neoplasm of unspecified site of unspecified female breast: Secondary | ICD-10-CM | POA: Diagnosis not present

## 2024-10-01 MED ORDER — GADOBUTROL 1 MMOL/ML IV SOLN
6.0000 mL | Freq: Once | INTRAVENOUS | Status: AC | PRN
  Administered 2024-10-01: 6 mL via INTRAVENOUS

## 2024-10-05 ENCOUNTER — Other Ambulatory Visit: Payer: Self-pay | Admitting: Internal Medicine

## 2024-10-05 DIAGNOSIS — R9389 Abnormal findings on diagnostic imaging of other specified body structures: Secondary | ICD-10-CM

## 2024-10-08 ENCOUNTER — Other Ambulatory Visit: Payer: Self-pay | Admitting: Internal Medicine

## 2024-10-08 ENCOUNTER — Ambulatory Visit: Admission: RE | Admit: 2024-10-08 | Source: Ambulatory Visit

## 2024-10-08 ENCOUNTER — Ambulatory Visit
Admission: RE | Admit: 2024-10-08 | Discharge: 2024-10-08 | Disposition: A | Discharge status: Home / Self Care | Source: Ambulatory Visit | Attending: Internal Medicine | Admitting: Internal Medicine

## 2024-10-08 DIAGNOSIS — R9389 Abnormal findings on diagnostic imaging of other specified body structures: Secondary | ICD-10-CM

## 2024-10-08 DIAGNOSIS — N6489 Other specified disorders of breast: Secondary | ICD-10-CM | POA: Diagnosis not present

## 2024-10-08 MED ORDER — GADOPICLENOL 0.5 MMOL/ML IV SOLN
7.0000 mL | Freq: Once | INTRAVENOUS | Status: AC | PRN
  Administered 2024-10-08: 08:00:00 7 mL via INTRAVENOUS

## 2024-10-14 ENCOUNTER — Other Ambulatory Visit

## 2024-10-14 ENCOUNTER — Encounter
